# Patient Record
Sex: Female | Born: 1960 | Race: White | Hispanic: No | Marital: Married | Smoking: Former smoker
Health system: Southern US, Community
[De-identification: ages and names within clinical notes are randomized; demographics above are authoritative.]

## PROBLEM LIST (undated history)

## (undated) DIAGNOSIS — B029 Zoster without complications: Secondary | ICD-10-CM

## (undated) DIAGNOSIS — I341 Nonrheumatic mitral (valve) prolapse: Secondary | ICD-10-CM

## (undated) DIAGNOSIS — B019 Varicella without complication: Secondary | ICD-10-CM

## (undated) DIAGNOSIS — E7800 Pure hypercholesterolemia, unspecified: Secondary | ICD-10-CM

## (undated) DIAGNOSIS — I10 Essential (primary) hypertension: Secondary | ICD-10-CM

## (undated) HISTORY — DX: Essential (primary) hypertension: I10

## (undated) HISTORY — DX: Pure hypercholesterolemia, unspecified: E78.00

## (undated) HISTORY — DX: Zoster without complications: B02.9

## (undated) HISTORY — DX: Varicella without complication: B01.9

## (undated) HISTORY — DX: Nonrheumatic mitral (valve) prolapse: I34.1

---
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
## 1978-09-25 DIAGNOSIS — I10 Essential (primary) hypertension: Secondary | ICD-10-CM

## 1978-09-25 HISTORY — DX: Essential (primary) hypertension: I10

## 1979-09-26 HISTORY — PX: AMPUTATION FINGER: SHX6594

## 1998-04-30 ENCOUNTER — Encounter: Admission: RE | Admit: 1998-04-30 | Discharge: 1998-07-29 | Payer: Self-pay | Admitting: Internal Medicine

## 1999-08-30 ENCOUNTER — Other Ambulatory Visit: Admission: RE | Admit: 1999-08-30 | Discharge: 1999-08-30 | Payer: Self-pay | Admitting: Obstetrics and Gynecology

## 2000-05-25 ENCOUNTER — Encounter: Admission: RE | Admit: 2000-05-25 | Discharge: 2000-05-25 | Payer: Self-pay | Admitting: Internal Medicine

## 2000-05-25 ENCOUNTER — Encounter: Payer: Self-pay | Admitting: Internal Medicine

## 2000-10-23 ENCOUNTER — Other Ambulatory Visit: Admission: RE | Admit: 2000-10-23 | Discharge: 2000-10-23 | Payer: Self-pay | Admitting: Obstetrics and Gynecology

## 2000-11-29 ENCOUNTER — Encounter: Payer: Self-pay | Admitting: Surgery

## 2000-11-29 ENCOUNTER — Ambulatory Visit (HOSPITAL_COMMUNITY): Admission: RE | Admit: 2000-11-29 | Discharge: 2000-11-29 | Payer: Self-pay | Admitting: Surgery

## 2001-10-08 ENCOUNTER — Encounter: Payer: Self-pay | Admitting: Obstetrics and Gynecology

## 2001-10-08 ENCOUNTER — Encounter: Admission: RE | Admit: 2001-10-08 | Discharge: 2001-10-08 | Payer: Self-pay | Admitting: Obstetrics and Gynecology

## 2001-10-31 ENCOUNTER — Other Ambulatory Visit: Admission: RE | Admit: 2001-10-31 | Discharge: 2001-10-31 | Payer: Self-pay | Admitting: Obstetrics and Gynecology

## 2002-10-07 LAB — HM COLONOSCOPY

## 2003-05-20 ENCOUNTER — Other Ambulatory Visit: Admission: RE | Admit: 2003-05-20 | Discharge: 2003-05-20 | Payer: Self-pay | Admitting: Obstetrics and Gynecology

## 2003-05-26 ENCOUNTER — Encounter: Payer: Self-pay | Admitting: Obstetrics and Gynecology

## 2003-05-26 ENCOUNTER — Encounter: Admission: RE | Admit: 2003-05-26 | Discharge: 2003-05-26 | Payer: Self-pay | Admitting: Obstetrics and Gynecology

## 2003-06-20 ENCOUNTER — Emergency Department (HOSPITAL_COMMUNITY): Admission: EM | Admit: 2003-06-20 | Discharge: 2003-06-20 | Payer: Self-pay

## 2003-09-26 HISTORY — PX: BREAST EXCISIONAL BIOPSY: SUR124

## 2003-09-26 HISTORY — PX: BREAST LUMPECTOMY: SHX2

## 2005-12-13 ENCOUNTER — Other Ambulatory Visit: Admission: RE | Admit: 2005-12-13 | Discharge: 2005-12-13 | Payer: Self-pay | Admitting: Obstetrics and Gynecology

## 2005-12-13 ENCOUNTER — Encounter: Admission: RE | Admit: 2005-12-13 | Discharge: 2005-12-13 | Payer: Self-pay | Admitting: Obstetrics and Gynecology

## 2006-01-02 ENCOUNTER — Encounter: Admission: RE | Admit: 2006-01-02 | Discharge: 2006-01-02 | Payer: Self-pay | Admitting: Obstetrics and Gynecology

## 2007-01-14 ENCOUNTER — Encounter: Admission: RE | Admit: 2007-01-14 | Discharge: 2007-01-14 | Payer: Self-pay | Admitting: Obstetrics and Gynecology

## 2008-02-12 ENCOUNTER — Encounter: Admission: RE | Admit: 2008-02-12 | Discharge: 2008-02-12 | Payer: Self-pay | Admitting: Family Medicine

## 2008-02-21 ENCOUNTER — Encounter: Admission: RE | Admit: 2008-02-21 | Discharge: 2008-02-21 | Payer: Self-pay | Admitting: Family Medicine

## 2009-02-15 ENCOUNTER — Encounter: Admission: RE | Admit: 2009-02-15 | Discharge: 2009-02-15 | Payer: Self-pay | Admitting: Family Medicine

## 2010-04-04 ENCOUNTER — Encounter: Admission: RE | Admit: 2010-04-04 | Discharge: 2010-04-04 | Payer: Self-pay | Admitting: Obstetrics and Gynecology

## 2011-02-10 NOTE — Op Note (Signed)
[place]  Patient:    Danielle Humphrey, Danielle Humphrey                        MRN: [id number] Proc. Date: 11/29/00 Attending:  Sandria Bales. Ezzard Standing, M.D. CC:         Huey Bienenstock McDiarmid, M.D.             Modesta Messing, M.D.                           Operative Report  PREOPERATIVE DIAGNOSIS:  Bilateral breast masses.  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  PROCEDURE:  Bilateral excisional breast biopsies using ultrasound localization.  SURGEON:  Sandria Bales. Ezzard Standing, M.D.  ANESTHESIA:  General LMA, with 30 cc of 0.25% Marcaine.  INDICATION FOR PROCEDURE:  Ms. Maniscalco is a 50 year old white female who has bilateral palpable breast masses.  She now comes for excision of these masses.  DESCRIPTION OF PROCEDURE:  Patient placed in supine position.  Before she was given a general LMA anesthesia, I used a 7.5 megahertz ultrasound probe to localize a mass in the right breast at the 10 oclock position.  It was actually fairly easily palpable, it felt soft and rubbery, and then the mass in the left breast in the 3 oclock position, which was less palpable, both areas were marked.  The breast was then prepped with Betadine solution and sterilely draped.  I then made an incision directly over the mass on the left side.  I used approximately 15 cc of 0.25% Marcaine as a local anesthetic, cut down, excising a block of breast tissue about 3 x 4 cm, and this palpated like something like a fibroadenoma.  I sent it for permanent pathology.  The wound was then irrigated, hemostasis controlled with 3-0 Vicryl sutures and Bovie electrocautery.  Subcutaneous tissues were closed with 3-0 Vicryl suture and the skin closed with a 5-0 Vicryl suture, painted with tincture of benzoin and Steri-Strips and sterilely dressed.  The right side, I approached the same way, again identifying the  mass through the markings, infiltrating using about 15 cc of 0.25% Marcaine plain.  I excised down to the mass, and in this area I was able to excise an approximately a 3.5 x 2 cm lipoma.  It had a totally benign appearance, but I sent this off for pathology also.  I then irrigated the wound.  Hemostasis was controlled with Bovie electrocautery and 3-0 Vicryl sutures.  The subcutaneous tissues were closed with 3-0 Vicryl sutures and the skin closed with 5-0 Vicryl suture.  Both wounds were painted with tincture of benzoin and Steri-Strips, sterilely dressed with 4 x 4s and Hypafix.  The patient will be discharged home today, see me back in one week for follow-up.  I will review the pathology with her and check her incisions. DD:  11/29/00 TD:  11/29/00 Job: [id number]

## 2011-04-19 ENCOUNTER — Other Ambulatory Visit: Payer: Self-pay | Admitting: Obstetrics and Gynecology

## 2011-04-19 DIAGNOSIS — Z1231 Encounter for screening mammogram for malignant neoplasm of breast: Secondary | ICD-10-CM

## 2011-04-24 ENCOUNTER — Ambulatory Visit
Admission: RE | Admit: 2011-04-24 | Discharge: 2011-04-24 | Disposition: A | Discharge status: Home / Self Care | Payer: 59 | Source: Ambulatory Visit | Attending: Obstetrics and Gynecology | Admitting: Obstetrics and Gynecology

## 2011-04-24 DIAGNOSIS — Z1231 Encounter for screening mammogram for malignant neoplasm of breast: Secondary | ICD-10-CM

## 2011-04-26 ENCOUNTER — Other Ambulatory Visit: Payer: Self-pay | Admitting: Obstetrics and Gynecology

## 2011-04-26 DIAGNOSIS — R928 Other abnormal and inconclusive findings on diagnostic imaging of breast: Secondary | ICD-10-CM

## 2011-05-04 ENCOUNTER — Ambulatory Visit
Admission: RE | Admit: 2011-05-04 | Discharge: 2011-05-04 | Disposition: A | Discharge status: Home / Self Care | Payer: 59 | Source: Ambulatory Visit | Attending: Obstetrics and Gynecology | Admitting: Obstetrics and Gynecology

## 2011-05-04 DIAGNOSIS — R928 Other abnormal and inconclusive findings on diagnostic imaging of breast: Secondary | ICD-10-CM

## 2012-06-27 ENCOUNTER — Other Ambulatory Visit: Payer: Self-pay | Admitting: Obstetrics and Gynecology

## 2012-06-27 DIAGNOSIS — Z1231 Encounter for screening mammogram for malignant neoplasm of breast: Secondary | ICD-10-CM

## 2012-07-22 ENCOUNTER — Ambulatory Visit
Admission: RE | Admit: 2012-07-22 | Discharge: 2012-07-22 | Disposition: A | Discharge status: Home / Self Care | Payer: 59 | Source: Ambulatory Visit | Attending: Obstetrics and Gynecology | Admitting: Obstetrics and Gynecology

## 2012-07-22 DIAGNOSIS — Z1231 Encounter for screening mammogram for malignant neoplasm of breast: Secondary | ICD-10-CM

## 2012-10-07 ENCOUNTER — Other Ambulatory Visit: Payer: Self-pay | Admitting: Gastroenterology

## 2013-06-17 ENCOUNTER — Other Ambulatory Visit: Payer: Self-pay

## 2013-06-17 DIAGNOSIS — Z1231 Encounter for screening mammogram for malignant neoplasm of breast: Secondary | ICD-10-CM

## 2013-07-23 ENCOUNTER — Ambulatory Visit
Admission: RE | Admit: 2013-07-23 | Discharge: 2013-07-23 | Disposition: A | Discharge status: Home / Self Care | Payer: 59 | Source: Ambulatory Visit

## 2013-07-23 DIAGNOSIS — Z1231 Encounter for screening mammogram for malignant neoplasm of breast: Secondary | ICD-10-CM

## 2013-11-03 ENCOUNTER — Emergency Department: Payer: Self-pay | Admitting: Emergency Medicine

## 2013-11-03 LAB — CBC
HCT: 41 % (ref 35.0–47.0)
HGB: 13.5 g/dL (ref 12.0–16.0)
MCH: 29.8 pg (ref 26.0–34.0)
MCHC: 32.8 g/dL (ref 32.0–36.0)
MCV: 91 fL (ref 80–100)
Platelet: 259 10*3/uL (ref 150–440)
RBC: 4.52 10*6/uL (ref 3.80–5.20)
RDW: 12.3 % (ref 11.5–14.5)
WBC: 8.1 10*3/uL (ref 3.6–11.0)

## 2013-11-03 LAB — COMPREHENSIVE METABOLIC PANEL
ALBUMIN: 4.1 g/dL (ref 3.4–5.0)
Alkaline Phosphatase: 34 U/L — ABNORMAL LOW
Anion Gap: 9 (ref 7–16)
BUN: 15 mg/dL (ref 7–18)
Bilirubin,Total: 0.4 mg/dL (ref 0.2–1.0)
Calcium, Total: 9.7 mg/dL (ref 8.5–10.1)
Chloride: 105 mmol/L (ref 98–107)
Co2: 24 mmol/L (ref 21–32)
Creatinine: 0.84 mg/dL (ref 0.60–1.30)
EGFR (Non-African Amer.): >60
GLUCOSE: 95 mg/dL (ref 65–99)
Osmolality: 276 (ref 275–301)
Potassium: 3.3 mmol/L — ABNORMAL LOW (ref 3.5–5.1)
SGOT(AST): 27 U/L (ref 15–37)
SGPT (ALT): 21 U/L (ref 12–78)
SODIUM: 138 mmol/L (ref 136–145)
TOTAL PROTEIN: 7.7 g/dL (ref 6.4–8.2)

## 2013-11-03 LAB — TROPONIN I: Troponin-I: <0.02 ng/mL

## 2013-11-04 ENCOUNTER — Other Ambulatory Visit (HOSPITAL_COMMUNITY): Payer: Self-pay | Admitting: Internal Medicine

## 2013-11-04 DIAGNOSIS — I341 Nonrheumatic mitral (valve) prolapse: Secondary | ICD-10-CM

## 2013-11-06 ENCOUNTER — Ambulatory Visit (INDEPENDENT_AMBULATORY_CARE_PROVIDER_SITE_OTHER): Payer: 59 | Admitting: Cardiology

## 2013-11-06 VITALS — BP 110/70 | HR 77 | Ht 70.0 in | Wt 175.0 lb

## 2013-11-06 DIAGNOSIS — R0989 Other specified symptoms and signs involving the circulatory and respiratory systems: Secondary | ICD-10-CM

## 2013-11-06 DIAGNOSIS — R0609 Other forms of dyspnea: Secondary | ICD-10-CM

## 2013-11-06 DIAGNOSIS — R079 Chest pain, unspecified: Secondary | ICD-10-CM

## 2013-11-06 DIAGNOSIS — R0789 Other chest pain: Secondary | ICD-10-CM

## 2013-11-06 DIAGNOSIS — R42 Dizziness and giddiness: Secondary | ICD-10-CM

## 2013-11-06 DIAGNOSIS — R0602 Shortness of breath: Secondary | ICD-10-CM

## 2013-11-06 NOTE — Patient Instructions (Signed)
Your physician recommends that you schedule a follow-up appointment in: 2 weeks to see Lyda Jester PA

## 2013-11-09 ENCOUNTER — Encounter: Payer: Self-pay | Admitting: Cardiology

## 2013-11-09 DIAGNOSIS — R42 Dizziness and giddiness: Secondary | ICD-10-CM | POA: Insufficient documentation

## 2013-11-09 DIAGNOSIS — R0609 Other forms of dyspnea: Secondary | ICD-10-CM

## 2013-11-09 DIAGNOSIS — R0789 Other chest pain: Secondary | ICD-10-CM | POA: Insufficient documentation

## 2013-11-09 NOTE — Assessment & Plan Note (Addendum)
With associated fatigue. Her PCP has already ordered a 2D echo. I agree to this. This will be done in our office.

## 2013-11-09 NOTE — Assessment & Plan Note (Signed)
Atypical. Mostly mid scapular and sharp, but occurs and is exacerbated by exertion with associated dyspnea, fatigue and dizziness. No symptoms at rest. EKG is w/o ischemic changes. Will evalute with a nuclear stress test.

## 2013-11-09 NOTE — Progress Notes (Signed)
Patient ID: DAJHA URQUILLA, female   DOB: 04-04-1961, 53 y.o.   MRN: [id number]  11/09/2013 Guneet Delpino [facility]   01/22/1961  [id number]  Primary Physicia Thressa Sheller, MD Primary Cardiologist: Debara Pickett  HPI:  The patient is a 53 y/o female who presents for evaluation of intermittent mid scapular pain, dizziness, SOB and fatigue. She denies any past cardiac history, but states that she has seen Dr. Debara Pickett in the past for an echocardiogram several years ago.   She states that she has recently noticed mid scapular pain on exertion. It is atypical and described as a sharp pain. She notes mild radiation to the chest, but no radiation to the jaw or upper extremity. It is worse with exertion and relieved with rest. She first noticed this several weeks ago while walking in her neighborhood. She has had associated SOB and dizziness. No n/v, syncope or near syncope. No palpitations. This has happened 1-2 times since then, but symptoms have resolved spontaneously. She denies any symptoms at rest. In clinic she is asymptomatic.   She is followed medically by Dr. Alyson Ingles. She has HTN, but denies DM and HLD.    Current Outpatient Prescriptions  Medication Sig Dispense Refill  . lisinopril-hydrochlorothiazide (PRINZIDE,ZESTORETIC) 20-25 MG per tablet Take 1 tablet by mouth daily.      Marland Kitchen ALPRAZolam (XANAX) 0.5 MG tablet       . felodipine (PLENDIL) 5 MG 24 hr tablet        No current facility-administered medications for this visit.    Allergies not on file  History   Social History  . Marital Status: Married    Spouse Name: N/A    Number of Children: N/A  . Years of Education: N/A   Occupational History  . Not on file.   Social History Main Topics  . Smoking status: Not on file  . Smokeless tobacco: Not on file  . Alcohol Use: Not on file  . Drug Use: Not on file  . Sexual Activity: Not on file   Other Topics Concern  . Not on file   Social History Narrative  . No narrative on file     Review  of Systems: General: negative for chills, fever, night sweats or weight changes.  Cardiovascular: negative for chest pain, dyspnea on exertion, edema, orthopnea, palpitations, paroxysmal nocturnal dyspnea or shortness of breath Dermatological: negative for rash Respiratory: negative for cough or wheezing Urologic: negative for hematuria Abdominal: negative for nausea, vomiting, diarrhea, bright red blood per rectum, melena, or hematemesis Neurologic: negative for visual changes, syncope, or dizziness All other systems reviewed and are otherwise negative except as noted above.    Blood pressure 110/70, pulse 77, height 5\' 10"  (1.778 m), weight 175 lb (79.379 kg).  General appearance: alert, cooperative and no distress Neck: no carotid bruit and no JVD Lungs: clear to auscultation bilaterally Heart: regular rate and rhythm Extremities: no LEE Pulses: 2+ and symmetric Skin: warm and dry Neurologic: Grossly normal  EKG NSR. No ischemic changes  ASSESSMENT AND PLAN:   Chest pain, atypical Atypical. Mostly mid scapular and sharp, but occurs and is exacerbated by exertion with associated dyspnea, fatigue and dizziness. No symptoms at rest. EKG is w/o ischemic changes. Will evalute with a nuclear stress test.   Dyspnea on exertion With associated fatigue. Her PCP has already ordered a 2D echo. I agree to this. This will be done in our office.   Dizziness This has been intermittent. Pt denies palpitations. EKG is w/o arrthymias.  HR and BP stable. Orthostatics were checked and she is not orthostatic. Physical exam was negative for murmurs and carotid bruits. If above w/u is normal and problem persist, will further evaluate with event monitor.     PLAN  Will work up with NST and 2D echo. Will have patient f/u in 1-2 weeks to review the results. Pt was instructed to go to ER if her symptoms return and do not improve with rest. She verbalized understanding.    SIMMONS,  BRITTAINYPA-C 11/09/2013 7:17 PM

## 2013-11-09 NOTE — Assessment & Plan Note (Signed)
This has been intermittent. Pt denies palpitations. EKG is w/o arrthymias. HR and BP stable. Orthostatics were checked and she is not orthostatic. Physical exam was negative for murmurs and carotid bruits. If above w/u is normal and problem persist, will further evaluate with event monitor.

## 2013-11-12 ENCOUNTER — Ambulatory Visit (HOSPITAL_COMMUNITY): Payer: 59

## 2013-11-14 ENCOUNTER — Ambulatory Visit (HOSPITAL_COMMUNITY)
Admission: RE | Admit: 2013-11-14 | Discharge: 2013-11-14 | Disposition: A | Discharge status: Home / Self Care | Payer: 59 | Source: Ambulatory Visit | Attending: Internal Medicine | Admitting: Internal Medicine

## 2013-11-14 DIAGNOSIS — R0609 Other forms of dyspnea: Secondary | ICD-10-CM | POA: Insufficient documentation

## 2013-11-14 DIAGNOSIS — I10 Essential (primary) hypertension: Secondary | ICD-10-CM | POA: Insufficient documentation

## 2013-11-14 DIAGNOSIS — R0602 Shortness of breath: Secondary | ICD-10-CM

## 2013-11-14 DIAGNOSIS — R5383 Other fatigue: Secondary | ICD-10-CM

## 2013-11-14 DIAGNOSIS — Z87891 Personal history of nicotine dependence: Secondary | ICD-10-CM | POA: Insufficient documentation

## 2013-11-14 DIAGNOSIS — I059 Rheumatic mitral valve disease, unspecified: Secondary | ICD-10-CM | POA: Insufficient documentation

## 2013-11-14 DIAGNOSIS — R002 Palpitations: Secondary | ICD-10-CM | POA: Insufficient documentation

## 2013-11-14 DIAGNOSIS — R079 Chest pain, unspecified: Secondary | ICD-10-CM | POA: Insufficient documentation

## 2013-11-14 DIAGNOSIS — R0989 Other specified symptoms and signs involving the circulatory and respiratory systems: Secondary | ICD-10-CM | POA: Insufficient documentation

## 2013-11-14 DIAGNOSIS — Z8249 Family history of ischemic heart disease and other diseases of the circulatory system: Secondary | ICD-10-CM | POA: Insufficient documentation

## 2013-11-14 DIAGNOSIS — R42 Dizziness and giddiness: Secondary | ICD-10-CM | POA: Insufficient documentation

## 2013-11-14 DIAGNOSIS — R5381 Other malaise: Secondary | ICD-10-CM | POA: Insufficient documentation

## 2013-11-14 MED ORDER — TECHNETIUM TC 99M SESTAMIBI GENERIC - CARDIOLITE
10.5000 | Freq: Once | INTRAVENOUS | Status: AC | PRN
  Administered 2013-11-14: 11 via INTRAVENOUS

## 2013-11-14 MED ORDER — REGADENOSON 0.4 MG/5ML IV SOLN
0.4000 mg | Freq: Once | INTRAVENOUS | Status: AC
  Administered 2013-11-14: 0.4 mg via INTRAVENOUS

## 2013-11-14 MED ORDER — TECHNETIUM TC 99M SESTAMIBI GENERIC - CARDIOLITE
30.2000 | Freq: Once | INTRAVENOUS | Status: AC | PRN
  Administered 2013-11-14: 30.2 via INTRAVENOUS

## 2013-11-14 MED ORDER — AMINOPHYLLINE 25 MG/ML IV SOLN
75.0000 mg | Freq: Once | INTRAVENOUS | Status: AC
  Administered 2013-11-14: 75 mg via INTRAVENOUS

## 2013-11-14 NOTE — Procedures (Addendum)
[place] [address] [place] [address] [phone]  Cardiology Nuclear Med Study  Danielle Humphrey is a 53 y.o. female     MRN : [id number]     DOB: 08-Aug-1961  Procedure Date: 11/14/2013  Nuclear Med Background Indication for Stress Test:  Evaluation for Ischemia and [facility] History:  MVP Cardiac Risk Factors: Family History - CAD, History of Smoking, Hypertension, Lipids and Overweight  Symptoms:  Chest Pain, Dizziness, DOE, Fatigue, Palpitations and SOB   Nuclear Pre-Procedure Caffeine/Decaff Intake:  1:00am NPO After: 11am   IV Site: R Hand  IV 0.9% NS with Angio Cath:  22g  Chest Size (in):  n/a IV Started by: Azucena Cecil, RN  Height: 5\' 10"  (1.778 m)  Cup Size: D  BMI:  Body mass index is 25.11 kg/(m^2). Weight:  175 lb (79.379 kg)   Tech Comments:  n/a    Nuclear Med Study 1 or 2 day study: 1 day  Stress Test Type:  [place] Provider:  Lyman Bishop, MD   Resting Radionuclide: Technetium 47m Sestamibi  Resting Radionuclide Dose: 10.5 mCi   Stress Radionuclide:  Technetium 52m Sestamibi  Stress Radionuclide Dose: 30.2 mCi           Stress Protocol Rest HR:61 Stress HR: 96  Rest BP: 139/101 Stress BP:147/91  Exercise Time (min): n/a METS: n/a          Dose of Adenosine (mg):  n/a Dose of Lexiscan: 0.4 mg  Dose of Atropine (mg): n/a Dose of Dobutamine: n/a mcg/kg/min (at max HR)  Stress Test Technologist: Mellody Memos, CCT Nuclear Technologist: Imagene Riches, CNMT   Rest Procedure:  Myocardial perfusion imaging was performed at rest 45 minutes following the intravenous administration of Technetium 60m Sestamibi. Stress Procedure:  The patient received IV Lexiscan 0.4 mg over 15-seconds.  Technetium 64m Sestamibi injected at 30-seconds. Due to patient's shortness of breath, dizziness and light-headedness, she was given IV Aminophylline 75 mg. Symptoms were resolved during  recovery. There were no significant changes with Lexiscan.  Quantitative spect images were obtained after a 45 minute delay.  Transient Ischemic Dilatation (Normal <1.22):  0.94 Lung/Heart Ratio (Normal <0.45):  0.28 QGS EDV:  78 ml QGS ESV:  20 ml LV Ejection Fraction: 74%  Signed by Joellyn Quails, Rad Tech on 11/14/2013 at 2:55 PM.      Rest ECG: NSR with non-specific ST-T wave changes  Stress ECG: No significant change from baseline ECG  QPS Raw Data Images:  Normal; no motion artifact; normal heart/lung ratio. Stress Images:  Normal homogeneous uptake in all areas of the myocardium. Rest Images:  Normal homogeneous uptake in all areas of the myocardium. Subtraction (SDS):  Normal LV Wall Motion:  NL LV Function; NL Wall Motion  Impression Exercise Capacity:  Lexiscan with no exercise. BP Response:  Hypertensive blood pressure response. Clinical Symptoms:  No significant symptoms noted. ECG Impression:  No significant ECG changes with Lexiscan. Comparison with Prior Nuclear Study: No previous nuclear study performed   Overall Impression:  Normal stress nuclear study.   Sanda Klein, MD  11/14/2013 5:24 PM

## 2013-11-18 ENCOUNTER — Ambulatory Visit: Payer: 59 | Admitting: Cardiology

## 2013-11-20 ENCOUNTER — Ambulatory Visit (HOSPITAL_COMMUNITY): Payer: 59

## 2013-11-26 ENCOUNTER — Ambulatory Visit (HOSPITAL_COMMUNITY)
Admission: RE | Admit: 2013-11-26 | Discharge: 2013-11-26 | Disposition: A | Discharge status: Home / Self Care | Payer: 59 | Source: Ambulatory Visit | Attending: Cardiovascular Disease | Admitting: Cardiovascular Disease

## 2013-11-26 DIAGNOSIS — I341 Nonrheumatic mitral (valve) prolapse: Secondary | ICD-10-CM

## 2013-11-26 DIAGNOSIS — I059 Rheumatic mitral valve disease, unspecified: Secondary | ICD-10-CM | POA: Insufficient documentation

## 2013-11-26 NOTE — Progress Notes (Signed)
2D Echo Performed 11/26/2013    Laurice Kimmons, RCS  

## 2013-11-27 ENCOUNTER — Ambulatory Visit (INDEPENDENT_AMBULATORY_CARE_PROVIDER_SITE_OTHER): Payer: 59 | Admitting: Cardiology

## 2013-11-27 VITALS — BP 110/80 | HR 72 | Ht 70.0 in | Wt 175.0 lb

## 2013-11-27 DIAGNOSIS — R0989 Other specified symptoms and signs involving the circulatory and respiratory systems: Secondary | ICD-10-CM

## 2013-11-27 DIAGNOSIS — R7989 Other specified abnormal findings of blood chemistry: Secondary | ICD-10-CM

## 2013-11-27 DIAGNOSIS — R0609 Other forms of dyspnea: Secondary | ICD-10-CM

## 2013-11-27 DIAGNOSIS — R0789 Other chest pain: Secondary | ICD-10-CM

## 2013-11-27 DIAGNOSIS — R42 Dizziness and giddiness: Secondary | ICD-10-CM

## 2013-11-27 DIAGNOSIS — R791 Abnormal coagulation profile: Secondary | ICD-10-CM

## 2013-11-27 LAB — CBC
HEMATOCRIT: 39.9 % (ref 36.0–46.0)
HEMOGLOBIN: 13.4 g/dL (ref 12.0–15.0)
MCH: 29.6 pg (ref 26.0–34.0)
MCHC: 33.6 g/dL (ref 30.0–36.0)
MCV: 88.1 fL (ref 78.0–100.0)
Platelets: 294 10*3/uL (ref 150–400)
RBC: 4.53 MIL/uL (ref 3.87–5.11)
RDW: 13.2 % (ref 11.5–15.5)
WBC: 7.6 10*3/uL (ref 4.0–10.5)

## 2013-11-27 LAB — BASIC METABOLIC PANEL
BUN: 18 mg/dL (ref 6–23)
CALCIUM: 10.6 mg/dL — AB (ref 8.4–10.5)
CHLORIDE: 103 meq/L (ref 96–112)
CO2: 26 meq/L (ref 19–32)
CREATININE: 0.88 mg/dL (ref 0.50–1.10)
Glucose, Bld: 130 mg/dL — ABNORMAL HIGH (ref 70–99)
POTASSIUM: 3.6 meq/L (ref 3.5–5.3)
SODIUM: 139 meq/L (ref 135–145)

## 2013-11-27 NOTE — Assessment & Plan Note (Addendum)
Normal nuclear stress test on 11/14/13. EF estimated at 74%

## 2013-11-27 NOTE — Patient Instructions (Signed)
Get Lab work done today (D-dimer, CBC and BMP) Wear heart monitor for 2 weeks. Activate heart monitor when you experience symptoms Return in 2-3 weeks with Lyda Jester, PA-C for follow-up

## 2013-11-27 NOTE — Assessment & Plan Note (Addendum)
2D echo on 11/14/13 demonstrated normal systolic function with an EF of 38-18%, grade I diastolic dysfunction, mild MR w/o prolapse. With continued DOE and atypical chest pain will check a D-dimer to r/o potential PE. Will also check CBC to r/o anemia.

## 2013-11-27 NOTE — Assessment & Plan Note (Addendum)
She continues to have intermittent dizziness with associated SOB and chest pain. She denies palpitations.  However, will prescribe a 2 week event monitor to assess for potential tachy/ brady arrhthymias.

## 2013-11-28 ENCOUNTER — Ambulatory Visit (HOSPITAL_COMMUNITY)
Admission: RE | Admit: 2013-11-28 | Discharge: 2013-11-28 | Disposition: A | Discharge status: Home / Self Care | Payer: 59 | Source: Ambulatory Visit | Attending: Cardiology | Admitting: Cardiology

## 2013-11-28 ENCOUNTER — Encounter: Payer: Self-pay | Admitting: Cardiology

## 2013-11-28 ENCOUNTER — Telehealth: Payer: Self-pay | Admitting: Cardiology

## 2013-11-28 DIAGNOSIS — R0602 Shortness of breath: Secondary | ICD-10-CM | POA: Insufficient documentation

## 2013-11-28 DIAGNOSIS — R0609 Other forms of dyspnea: Secondary | ICD-10-CM

## 2013-11-28 DIAGNOSIS — I714 Abdominal aortic aneurysm, without rupture, unspecified: Secondary | ICD-10-CM | POA: Insufficient documentation

## 2013-11-28 DIAGNOSIS — R0789 Other chest pain: Secondary | ICD-10-CM

## 2013-11-28 DIAGNOSIS — R7989 Other specified abnormal findings of blood chemistry: Secondary | ICD-10-CM

## 2013-11-28 DIAGNOSIS — R079 Chest pain, unspecified: Secondary | ICD-10-CM | POA: Insufficient documentation

## 2013-11-28 LAB — D-DIMER, QUANTITATIVE: D-Dimer, Quant: 1.33 ug/mL-FEU — ABNORMAL HIGH (ref 0.00–0.48)

## 2013-11-28 MED ORDER — IOHEXOL 350 MG/ML SOLN
100.0000 mL | Freq: Once | INTRAVENOUS | Status: AC | PRN
  Administered 2013-11-28: 100 mL via INTRAVENOUS

## 2013-11-28 NOTE — Telephone Encounter (Signed)
I ordered a D-dimer yesterday at office visit, as the patient has had continued DOE and chest pain over the last several weeks. Cardiac w/u including a NST and 2D echo was normal.The D-dimer was elevated at 1.33. She was notified via phone by office RN of the abnormal value. She has been instructed to report to [facility] for a CT Angio to r/o potential PE. She stated that she would report there promptly.  Lyda Jester, PA-C [facility]

## 2013-11-28 NOTE — Progress Notes (Signed)
Patient ID: Danielle Humphrey, female   DOB: January 16, 1961, 53 y.o.   MRN: [id number]    11/28/2013 Tashara Suder [facility]   09-02-1961  [id number]  Primary Physicia Thressa Sheller, MD Primary Cardiologist: Debara Pickett  HPI:  Danielle Humphrey returns to clinic for follow up for ongoing DOE, intermittent left sided chest pain and dizziness. Her only PMH is significant for treated HTN. She is followed medically by Dr. Noah Delaine.  She was last seen by myself on 11/06/13. At that visit, I ordered both a NST and 2D echocardiogram. She had a normal nuclear stress study. The echocardiogram demonstrated normal systolic function with an EF of 33-00%, grade I diastolic dysfunction and mild MR w/o prolapse.   Today, she states that she continues to have symptoms with light exertion. No symptoms at rest. Although she has experienced SOB, CP and dizziness, she denies palpitations and syncope/ near syncope.    Current Outpatient Prescriptions  Medication Sig Dispense Refill  . ALPRAZolam (XANAX) 0.5 MG tablet       . felodipine (PLENDIL) 5 MG 24 hr tablet       . lisinopril-hydrochlorothiazide (PRINZIDE,ZESTORETIC) 20-25 MG per tablet Take 1 tablet by mouth daily.       No current facility-administered medications for this visit.    No Known Allergies  History   Social History  . Marital Status: Married    Spouse Name: N/A    Number of Children: N/A  . Years of Education: N/A   Occupational History  . Not on file.   Social History Main Topics  . Smoking status: Never Smoker   . Smokeless tobacco: Not on file  . Alcohol Use: Not on file  . Drug Use: Not on file  . Sexual Activity: Not on file   Other Topics Concern  . Not on file   Social History Narrative  . No narrative on file     Review of Systems: General: negative for chills, fever, night sweats or weight changes.  Cardiovascular: negative for chest pain, dyspnea on exertion, edema, orthopnea, palpitations, paroxysmal nocturnal dyspnea or shortness of  breath Dermatological: negative for rash Respiratory: negative for cough or wheezing Urologic: negative for hematuria Abdominal: negative for nausea, vomiting, diarrhea, bright red blood per rectum, melena, or hematemesis Neurologic: negative for visual changes, syncope, or dizziness All other systems reviewed and are otherwise negative except as noted above.    Blood pressure 110/80, pulse 72, height 5\' 10"  (1.778 m), weight 175 lb (79.379 kg).  General appearance: alert, cooperative and no distress Neck: no carotid bruit and no JVD Lungs: clear to auscultation bilaterally Heart: regular rate and rhythm, S1, S2 normal, no murmur, click, rub or gallop Extremities: no LEE Pulses: 2+ and symmetric Skin: warm and dry Neurologic: Grossly normal  EKG NSR with nonspecific Twave abnormalities.   ASSESSMENT AND PLAN:   Chest pain, atypical Normal nuclear stress test on 11/14/13. EF estimated at 74%   Dyspnea on exertion 2D echo on 11/14/13 demonstrated normal systolic function with an EF of 76-22%, grade I diastolic dysfunction, mild MR w/o prolapse. With continued DOE and atypical chest pain will check a D-dimer to r/o potential PE. Will also check CBC to r/o anemia.    Dizziness She continues to have intermittent dizziness with associated SOB and chest pain. She denies palpitations.  However, will prescribe a 2 week event monitor to assess for potential tachy/ brady arrhthymias.    PLAN  Pt continues to have DOE with associated chest pain and dizziness.  F/U thus far, including a NST and 2 D echo, has been normal. In the setting of continued symptoms, I will order a D-dimer to help r/o potential PE. Will also check a CBC to rule out anemia. Will also arrange a 2 week event monitor to assess for any potential tachy/brady arrhthymias. I have also ordered a BMP to assess electrolytes and renal function. If D-dimer returns elevated, will further assess with a CT Angio. If normal, will have  patient return in 2-3 weeks to review telemetry results.   SIMMONS, BRITTAINYPA-C 11/28/2013 3:48 PM

## 2013-12-02 ENCOUNTER — Telehealth: Payer: Self-pay | Admitting: Nurse Practitioner

## 2013-12-02 NOTE — Telephone Encounter (Signed)
Thanks Museum/gallery conservator. I just called the patient and notified her that everything was ok.

## 2013-12-02 NOTE — Telephone Encounter (Signed)
Returned call and pt verified x 2.  Pt informed message received and results not reviewed.  Informed she will be notified once reviewed and urgent results are usually notified immediately.  Pt also informed she is supposed to wear the heart monitor for 2 weeks and return for f/u appt.  Pt verbalized understanding and agreed w/ plan.  Pt stated she is just concerned b/c she was told to report to have the test done in 45 mins on Friday and hasn't heard anything back.  Pt informed Lyda Jester, PA-C will be notified so that her results can be reviewed and she will be notified.  Pt verbalized understanding and agreed w/ plan.  Message forwarded to [employer], PA-C.

## 2013-12-02 NOTE — Telephone Encounter (Signed)
I called patient and notified her that her CT was negative for PE. I apologized for any confusion.   Lyda Jester, PA-C

## 2013-12-02 NOTE — Telephone Encounter (Signed)
Pt had a test on Friday at [facility] still waiting for results. She also wants to know how long is she suppose to wear her monitor?

## 2013-12-17 ENCOUNTER — Ambulatory Visit (INDEPENDENT_AMBULATORY_CARE_PROVIDER_SITE_OTHER): Payer: 59 | Admitting: Cardiology

## 2013-12-17 ENCOUNTER — Encounter: Payer: Self-pay | Admitting: Cardiology

## 2013-12-17 VITALS — BP 139/89 | HR 60 | Ht 70.0 in | Wt 171.5 lb

## 2013-12-17 DIAGNOSIS — R42 Dizziness and giddiness: Secondary | ICD-10-CM

## 2013-12-17 DIAGNOSIS — R0609 Other forms of dyspnea: Secondary | ICD-10-CM

## 2013-12-17 DIAGNOSIS — R0789 Other chest pain: Secondary | ICD-10-CM

## 2013-12-17 DIAGNOSIS — R0600 Dyspnea, unspecified: Secondary | ICD-10-CM

## 2013-12-17 DIAGNOSIS — R0989 Other specified symptoms and signs involving the circulatory and respiratory systems: Secondary | ICD-10-CM

## 2013-12-17 NOTE — Patient Instructions (Signed)
Continue current medications as prescribed Follow-up with Dr. Debara Pickett in 6 months.

## 2013-12-19 NOTE — Progress Notes (Signed)
Patient ID: Danielle Humphrey, female   DOB: 04/26/1961, 53 y.o.   MRN: [id number]     12/16/2013 Danielle Humphrey [facility]   28-Jul-1961  [id number]  Primary Physicia Thressa Sheller, MD Primary Cardiologist: Debara Pickett  HPI:  Mrs. Danielle Humphrey returns to clinic for follow up for ongoing DOE, intermittent left sided chest pain and dizziness. Her only PMH is significant for treated HTN. She is followed medically by Dr. Noah Delaine.  She was initially seen by myself on 11/06/13. At that visit, I ordered both a NST and 2D echocardiogram. She had a normal nuclear stress study. The echocardiogram demonstrated normal systolic function with an EF of 01-60%, grade I diastolic dysfunction and mild MR w/o prolapse.   She presented back on 11/27/2013 and continued to have symptoms with light exertion, particularly SOB, slight chest discomfort and dizziness. I ordered a D-dimer, to rule out PE, given her continued dyspnea. It returned positive at 1.33. Subsequently, she underwent a chest CT, however this was negative for PE. CBC was also ordered but did not show anemia.  I also ordered a 2 week event monitor to assess for potential arrhthymias.  This showed a combination of sinus tachycardia and sinus bradycardia. She has had heart rates as high as the low 100s-110s, which correlates with the timing of her symptoms. Her bradycardia has occurred primarily at night and she has had rates in the 40s and 30s. No captured bradycardia during the day.   She returns to clinic for f/u. She continues to have mild symptoms but not as severe, now that she has a better piece of mind after her negative studies.     Current Outpatient Prescriptions  Medication Sig Dispense Refill  . ALPRAZolam (XANAX) 0.5 MG tablet       . felodipine (PLENDIL) 5 MG 24 hr tablet       . lisinopril-hydrochlorothiazide (PRINZIDE,ZESTORETIC) 20-25 MG per tablet Take 1 tablet by mouth daily.       No current facility-administered medications for this visit.    No Known  Allergies  History   Social History  . Marital Status: Married    Spouse Name: N/A    Number of Children: N/A  . Years of Education: N/A   Occupational History  . Not on file.   Social History Main Topics  . Smoking status: Never Smoker   . Smokeless tobacco: Not on file  . Alcohol Use: Not on file  . Drug Use: Not on file  . Sexual Activity: Not on file   Other Topics Concern  . Not on file   Social History Narrative  . No narrative on file     Review of Systems: General: negative for chills, fever, night sweats or weight changes.  Cardiovascular: negative for chest pain, dyspnea on exertion, edema, orthopnea, palpitations, paroxysmal nocturnal dyspnea or shortness of breath Dermatological: negative for rash Respiratory: negative for cough or wheezing Urologic: negative for hematuria Abdominal: negative for nausea, vomiting, diarrhea, bright red blood per rectum, melena, or hematemesis Neurologic: negative for visual changes, syncope, or dizziness All other systems reviewed and are otherwise negative except as noted above.    Blood pressure 139/89, pulse 60, height 5\' 10"  (1.778 m), weight 171 lb 8 oz (77.792 kg).  General appearance: alert, cooperative and no distress Neck: no carotid bruit and no JVD Lungs: clear to auscultation bilaterally Heart: regular rate and rhythm, S1, S2 normal, no murmur, click, rub or gallop Extremities: no LEE Pulses: 2+ and symmetric Skin: warm and dry  Neurologic: Grossly normal     ASSESSMENT AND PLAN:   Chest Pain/ Dyspnea: extensive w/u has been negative including NST, Chest CT, 2D echo and labs.   Dizziness: Pt noted to have sinus tach w/ rates in the low 100s-110s during these episodes, as captured on cardiac event monitor. There were no arrhthymias. 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 I've discussed case with Dr. Gwenlyn Found and he also reviewed her  Cardionet strips. He also recommends against use of BB/ CCB to control her symptoms and tachycardia.    PLAN  Follow up as needed.   Aiyonna Lucado, BRITTAINYPA-C 12/19/2013 3:21 PM

## 2014-03-25 ENCOUNTER — Other Ambulatory Visit: Payer: Self-pay | Admitting: *Deleted

## 2014-03-25 DIAGNOSIS — R0789 Other chest pain: Secondary | ICD-10-CM

## 2014-03-25 DIAGNOSIS — R0609 Other forms of dyspnea: Secondary | ICD-10-CM

## 2014-03-25 DIAGNOSIS — R42 Dizziness and giddiness: Secondary | ICD-10-CM

## 2014-09-25 DIAGNOSIS — I341 Nonrheumatic mitral (valve) prolapse: Secondary | ICD-10-CM

## 2014-09-25 DIAGNOSIS — B029 Zoster without complications: Secondary | ICD-10-CM

## 2014-09-25 HISTORY — DX: Zoster without complications: B02.9

## 2014-09-25 HISTORY — DX: Nonrheumatic mitral (valve) prolapse: I34.1

## 2015-04-23 ENCOUNTER — Other Ambulatory Visit: Payer: Self-pay

## 2015-04-23 DIAGNOSIS — Z1231 Encounter for screening mammogram for malignant neoplasm of breast: Secondary | ICD-10-CM

## 2015-05-17 ENCOUNTER — Ambulatory Visit
Admission: RE | Admit: 2015-05-17 | Discharge: 2015-05-17 | Disposition: A | Discharge status: Home / Self Care | Payer: 59 | Source: Ambulatory Visit

## 2015-05-17 DIAGNOSIS — Z1231 Encounter for screening mammogram for malignant neoplasm of breast: Secondary | ICD-10-CM

## 2015-09-26 DIAGNOSIS — E7800 Pure hypercholesterolemia, unspecified: Secondary | ICD-10-CM

## 2015-09-26 HISTORY — DX: Pure hypercholesterolemia, unspecified: E78.00

## 2016-07-04 ENCOUNTER — Other Ambulatory Visit: Payer: Self-pay | Admitting: Internal Medicine

## 2016-07-04 DIAGNOSIS — Z1231 Encounter for screening mammogram for malignant neoplasm of breast: Secondary | ICD-10-CM

## 2016-07-13 ENCOUNTER — Ambulatory Visit: Payer: 59

## 2016-07-27 ENCOUNTER — Ambulatory Visit
Admission: RE | Admit: 2016-07-27 | Discharge: 2016-07-27 | Disposition: A | Discharge status: Home / Self Care | Payer: 59 | Source: Ambulatory Visit | Attending: Internal Medicine | Admitting: Internal Medicine

## 2016-07-27 ENCOUNTER — Encounter: Payer: Self-pay | Admitting: Radiology

## 2016-07-27 DIAGNOSIS — Z1231 Encounter for screening mammogram for malignant neoplasm of breast: Secondary | ICD-10-CM

## 2016-10-19 ENCOUNTER — Encounter: Payer: Self-pay | Admitting: Family

## 2016-10-19 ENCOUNTER — Ambulatory Visit (INDEPENDENT_AMBULATORY_CARE_PROVIDER_SITE_OTHER): Payer: 59 | Admitting: Family

## 2016-10-19 VITALS — BP 120/78 | HR 67 | Temp 97.2°F | Resp 16 | Ht 69.25 in | Wt 176.0 lb

## 2016-10-19 DIAGNOSIS — E785 Hyperlipidemia, unspecified: Secondary | ICD-10-CM

## 2016-10-19 DIAGNOSIS — I10 Essential (primary) hypertension: Secondary | ICD-10-CM | POA: Diagnosis not present

## 2016-10-19 DIAGNOSIS — F411 Generalized anxiety disorder: Secondary | ICD-10-CM | POA: Diagnosis not present

## 2016-10-19 MED ORDER — FELODIPINE ER 5 MG PO TB24: 5.0000 mg | ORAL_TABLET | Freq: Every day | ORAL | 3 refills | Status: DC

## 2016-10-19 NOTE — Assessment & Plan Note (Signed)
At goal. Continue current regimen. 

## 2016-10-19 NOTE — Patient Instructions (Signed)
Pleasure meeting you.  Please make physical at your convienence

## 2016-10-19 NOTE — Progress Notes (Signed)
Pre-visit discussion using our clinic review tool. No additional management support is needed unless otherwise documented below in the visit note.  

## 2016-10-19 NOTE — Assessment & Plan Note (Signed)
Continue current regimen. Will check lipid panel at CPE.

## 2016-10-19 NOTE — Assessment & Plan Note (Signed)
Uncontrolled. Using xanax daily. Discussed risks of BZDs and patient agreed with long term plan of decreasing/discontinuing. Discussed zoloft and will likely try at next visit.

## 2016-10-19 NOTE — Progress Notes (Signed)
Subjective:    Patient ID: Danielle Humphrey, female    DOB: Sep 17, 1961, 56 y.o.   MRN: [id number]  CC: Danielle Humphrey is a 56 y.o. female who presents today to establish care.    HPI: Prior care had been with [facility], [facility].   HTN- Compliant with medication. Denies exertional chest pain or pressure, numbness or tingling radiating to left arm or jaw, palpitations, dizziness, frequent headaches, changes in vision, or shortness of breath.   HLD- compliant with medication.   Anxiety-Has been on years. xanax every day. Has panic attacks. Notes stress from work. Using for sleep. Tried paxil many years ago however didn't work. No depression.           HISTORY:  Past Medical History:  Diagnosis Date  . Chickenpox 1960  . Hypercholesteremia 2017  . Hypertension 1980  . Mitral valve prolapse 2016  . Shingles 2016   Past Surgical History:  Procedure Laterality Date  . 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 APPENDECTOMY Bilateral 1968  . BREAST LUMPECTOMY Bilateral 2005   Family History  Problem Relation Age of Onset  . Ovarian cancer Mother     24  . Breast cancer Mother     2005  . Dementia Mother   . Stroke Mother   . Lupus Mother   . Hypercholesterolemia Father   . Heart attack Father     12  . Hypertension Father   . Diabetes type II Sister   . Heart disease Brother     Allergies: Codeine and Methotrexate derivatives Current Outpatient Prescriptions on File Prior to Visit  Medication Sig Dispense Refill  . ALPRAZolam (XANAX) 0.5 MG tablet     . lisinopril-hydrochlorothiazide (PRINZIDE,ZESTORETIC) 20-25 MG per tablet Take 1 tablet by mouth daily.     No current facility-administered medications on file prior to visit.     Social History  Substance Use Topics  . Smoking status: Never Smoker  . Smokeless tobacco: Never Used  . Alcohol use 4.2 oz/week    3 Glasses of wine, 4 Cans of beer per week    Review of Systems    Constitutional: Negative for chills and fever.  Respiratory: Negative for cough.   Cardiovascular: Negative for chest pain and palpitations.  Gastrointestinal: Negative for nausea and vomiting.  Psychiatric/Behavioral: Positive for sleep disturbance. The patient is nervous/anxious.       Objective:    BP 120/78   Pulse 67   Temp 97.2 F (36.2 C) (Oral)   Resp 16   Ht 5' 9.25" (1.759 m)   Wt 176 lb (79.8 kg)   SpO2 97%   BMI 25.80 kg/m  BP Readings from Last 3 Encounters:  10/19/16 120/78  12/17/13 139/89  11/27/13 110/80   Wt Readings from Last 3 Encounters:  10/19/16 176 lb (79.8 kg)  12/17/13 171 lb 8 oz (77.8 kg)  11/27/13 175 lb (79.4 kg)    Physical Exam  Constitutional: She appears well-developed and well-nourished.  Eyes: Conjunctivae are normal.  Cardiovascular: Normal rate, regular rhythm, normal heart sounds and normal pulses.   Pulmonary/Chest: Effort normal and breath sounds normal. She has no wheezes. She has no rhonchi. She has no rales.  Neurological: She is alert.  Skin: Skin is warm and dry.  Psychiatric: She has a normal mood and affect. Her speech is normal and behavior is normal. Thought content normal.  Vitals reviewed.      Assessment &  Plan:   Problem List Items Addressed This Visit      Cardiovascular and Mediastinum   HTN (hypertension) - Primary    At goal. Continue current regimen      Relevant Medications   pravastatin (PRAVACHOL) 10 MG tablet   felodipine (PLENDIL) 5 MG 24 hr tablet     Other   HLD (hyperlipidemia)    Continue current regimen. Will check lipid panel at CPE.       Relevant Medications   pravastatin (PRAVACHOL) 10 MG tablet   felodipine (PLENDIL) 5 MG 24 hr tablet   Generalized anxiety disorder    Uncontrolled. Using xanax daily. Discussed risks of BZDs and patient agreed with long term plan of decreasing/discontinuing. Discussed zoloft and will likely try at next visit.            I have changed Ms.  Demby felodipine. I am also having her maintain her ALPRAZolam, lisinopril-hydrochlorothiazide, pravastatin, FLUARIX QUADRIVALENT, and naproxen sodium.   Meds ordered this encounter  Medications  . pravastatin (PRAVACHOL) 10 MG tablet    Sig: Take 1 tablet by mouth daily.  Marland Kitchen FLUARIX QUADRIVALENT 0.5 ML injection    Sig: TO BE ADMINISTERED BY PHARMACIST FOR IMMUNIZATION    Refill:  0  . naproxen sodium (ALEVE) 220 MG tablet    Sig: Take 220 mg by mouth 2 (two) times daily with a meal.  . felodipine (PLENDIL) 5 MG 24 hr tablet    Sig: Take 1 tablet (5 mg total) by mouth daily.    Dispense:  30 tablet    Refill:  3    Order Specific Question:   Supervising Provider    Answer:   Crecencio Mc [[id number]]    Return precautions given.   Risks, benefits, and alternatives of the medications and treatment plan prescribed today were discussed, and patient expressed understanding.   Education regarding symptom management and diagnosis given to patient on AVS.  Continue to follow with Mable Paris, FNP for routine health maintenance.   Danielle Humphrey and I agreed with plan.   Mable Paris, FNP  Total of 15 minutes spent with patient today, greater than 50% of which was spent in discussion of  Anxiety and risk of BZDs.

## 2016-10-30 ENCOUNTER — Telehealth: Payer: Self-pay | Admitting: Family

## 2016-10-30 NOTE — Telephone Encounter (Signed)
rec'd from [place] forward 9 pages to [place] FNP

## 2016-11-08 ENCOUNTER — Telehealth: Payer: Self-pay | Admitting: Family

## 2016-11-08 DIAGNOSIS — F411 Generalized anxiety disorder: Secondary | ICD-10-CM

## 2016-11-08 NOTE — Telephone Encounter (Signed)
Pt called and is requesting a refill on lisinopril-hydrochlorothiazide (PRINZIDE,ZESTORETIC) 20-25 MG per tablet and ALPRAZolam (XANAX) 0.5 MG tablet. Please advise, thank you!  [place], [place]  Call pt @ [phone]

## 2016-11-09 MED ORDER — LISINOPRIL-HYDROCHLOROTHIAZIDE 20-25 MG PO TABS: 1.0000 | ORAL_TABLET | Freq: Every day | ORAL | 1 refills | Status: DC

## 2016-11-09 MED ORDER — PRAVASTATIN SODIUM 10 MG PO TABS: 10.0000 mg | ORAL_TABLET | Freq: Every day | ORAL | 1 refills | Status: DC

## 2016-11-09 MED ORDER — ALPRAZOLAM 0.5 MG PO TABS: 0.5000 mg | ORAL_TABLET | Freq: Every day | ORAL | 0 refills | Status: DC | PRN

## 2016-11-09 NOTE — Telephone Encounter (Signed)
Pt called to follow up about her BP medication it's been 2 days that she has not had it. Thank you!

## 2016-11-09 NOTE — Telephone Encounter (Signed)
Other medication has been refilled. Patient only needs xanax.

## 2016-11-09 NOTE — Telephone Encounter (Signed)
Refill request for Xanax, last seen [id number], last filled 05/2014.  Please advise.

## 2017-01-15 ENCOUNTER — Other Ambulatory Visit: Payer: Self-pay | Admitting: Family

## 2017-01-15 DIAGNOSIS — F411 Generalized anxiety disorder: Secondary | ICD-10-CM

## 2017-01-15 NOTE — Telephone Encounter (Signed)
.  Refill request for Xanax, last seen [id number], last filled [id number].  Please advise.

## 2017-04-10 ENCOUNTER — Ambulatory Visit: Payer: 59 | Admitting: Family

## 2017-04-10 ENCOUNTER — Telehealth: Payer: Self-pay | Admitting: Family

## 2017-04-10 DIAGNOSIS — Z0289 Encounter for other administrative examinations: Secondary | ICD-10-CM

## 2017-04-10 NOTE — Telephone Encounter (Signed)
Cancelled appt, FYI

## 2017-04-10 NOTE — Telephone Encounter (Signed)
FYI - Pt cancelled appt, feeling better.

## 2017-04-24 ENCOUNTER — Other Ambulatory Visit (HOSPITAL_COMMUNITY)
Admission: RE | Admit: 2017-04-24 | Discharge: 2017-04-24 | Disposition: A | Discharge status: Home / Self Care | Payer: 59 | Source: Ambulatory Visit | Attending: Family | Admitting: Family

## 2017-04-24 ENCOUNTER — Encounter: Payer: Self-pay | Admitting: Family

## 2017-04-24 ENCOUNTER — Ambulatory Visit (INDEPENDENT_AMBULATORY_CARE_PROVIDER_SITE_OTHER): Payer: 59 | Admitting: Family

## 2017-04-24 VITALS — BP 130/80 | HR 62 | Temp 97.7°F | Ht 69.25 in | Wt 188.6 lb

## 2017-04-24 DIAGNOSIS — Z0000 Encounter for general adult medical examination without abnormal findings: Secondary | ICD-10-CM

## 2017-04-24 DIAGNOSIS — F411 Generalized anxiety disorder: Secondary | ICD-10-CM

## 2017-04-24 DIAGNOSIS — I10 Essential (primary) hypertension: Secondary | ICD-10-CM | POA: Diagnosis not present

## 2017-04-24 LAB — COMPREHENSIVE METABOLIC PANEL
ALK PHOS: 33 U/L — AB (ref 39–117)
ALT: 13 U/L (ref 0–35)
AST: 16 U/L (ref 0–37)
Albumin: 4.5 g/dL (ref 3.5–5.2)
BUN: 19 mg/dL (ref 6–23)
CALCIUM: 10 mg/dL (ref 8.4–10.5)
CO2: 27 meq/L (ref 19–32)
Chloride: 103 mEq/L (ref 96–112)
Creatinine, Ser: 0.74 mg/dL (ref 0.40–1.20)
GFR: 85.7 mL/min (ref >60.00)
GLUCOSE: 100 mg/dL — AB (ref 70–99)
Potassium: 3.8 mEq/L (ref 3.5–5.1)
Sodium: 138 mEq/L (ref 135–145)
TOTAL PROTEIN: 7.5 g/dL (ref 6.0–8.3)
Total Bilirubin: 0.6 mg/dL (ref 0.2–1.2)

## 2017-04-24 LAB — LIPID PANEL
CHOL/HDL RATIO: 5
Cholesterol: 246 mg/dL — ABNORMAL HIGH (ref 0–200)
HDL: 53.7 mg/dL (ref >39.00)
LDL Cholesterol: 159 mg/dL — ABNORMAL HIGH (ref 0–99)
NONHDL: 191.88
TRIGLYCERIDES: 165 mg/dL — AB (ref 0.0–149.0)
VLDL: 33 mg/dL (ref 0.0–40.0)

## 2017-04-24 LAB — CBC WITH DIFFERENTIAL/PLATELET
Basophils Absolute: 0 10*3/uL (ref 0.0–0.1)
Basophils Relative: 0.6 % (ref 0.0–3.0)
Eosinophils Absolute: 0.1 10*3/uL (ref 0.0–0.7)
Eosinophils Relative: 2 % (ref 0.0–5.0)
HCT: 42 % (ref 36.0–46.0)
Hemoglobin: 14 g/dL (ref 12.0–15.0)
Lymphocytes Relative: 41.3 % (ref 12.0–46.0)
Lymphs Abs: 2.9 10*3/uL (ref 0.7–4.0)
MCHC: 33.2 g/dL (ref 30.0–36.0)
MCV: 92.7 fl (ref 78.0–100.0)
MONOS PCT: 5.4 % (ref 3.0–12.0)
Monocytes Absolute: 0.4 10*3/uL (ref 0.1–1.0)
Neutro Abs: 3.6 10*3/uL (ref 1.4–7.7)
Neutrophils Relative %: 50.7 % (ref 43.0–77.0)
PLATELETS: 263 10*3/uL (ref 150.0–400.0)
RBC: 4.53 Mil/uL (ref 3.87–5.11)
RDW: 13 % (ref 11.5–15.5)
WBC: 7 10*3/uL (ref 4.0–10.5)

## 2017-04-24 LAB — VITAMIN D 25 HYDROXY (VIT D DEFICIENCY, FRACTURES): VITD: 31.44 ng/mL (ref 30.00–100.00)

## 2017-04-24 LAB — TSH: TSH: 2.65 u[IU]/mL (ref 0.35–4.50)

## 2017-04-24 LAB — HEMOGLOBIN A1C: Hgb A1c MFr Bld: 5.5 % (ref 4.6–6.5)

## 2017-04-24 NOTE — Assessment & Plan Note (Signed)
Clinical breast exam and Pap smear performed today. Patient is up-to-date on colonoscopy and mammogram. Screening labs ordered. Encouraged exercise. Referral dermatology for annual skin check.

## 2017-04-24 NOTE — Progress Notes (Signed)
Pre visit review using our clinic review tool, if applicable. No additional management support is needed unless otherwise documented below in the visit note. 

## 2017-04-24 NOTE — Patient Instructions (Signed)
Referral to derm  Labs today  Health Maintenance, Female Adopting a healthy lifestyle and getting preventive care can go a long way to promote health and wellness. Talk with your health care provider about what schedule of regular examinations is right for you. This is a good chance for you to check in with your provider about disease prevention and staying healthy. In between checkups, there are plenty of things you can do on your own. Experts have done a lot of research about which lifestyle changes and preventive measures are most likely to keep you healthy. Ask your health care provider for more information. Weight and diet Eat a healthy diet  Be sure to include plenty of vegetables, fruits, low-fat dairy products, and lean protein.  Do not eat a lot of foods high in solid fats, added sugars, or salt.  Get regular exercise. This is one of the most important things you can do for your health. ? Most adults should exercise for at least 150 minutes each week. The exercise should increase your heart rate and make you sweat (moderate-intensity exercise). ? Most adults should also do strengthening exercises at least twice a week. This is in addition to the moderate-intensity exercise.  Maintain a healthy weight  Body mass index (BMI) is a measurement that can be used to identify possible weight problems. It estimates body fat based on height and weight. Your health care provider can help determine your BMI and help you achieve or maintain a healthy weight.  For females 42 years of age and older: ? A BMI below 18.5 is considered underweight. ? A BMI of 18.5 to 24.9 is normal. ? A BMI of 25 to 29.9 is considered overweight. ? A BMI of 30 and above is considered obese.  Watch levels of cholesterol and blood lipids  You should start having your blood tested for lipids and cholesterol at 59 years of age, then have this test every 5 years.  You may need to have your cholesterol levels checked  more often if: ? Your lipid or cholesterol levels are high. ? You are older than 58 years of age. ? You are at high risk for heart disease.  Cancer screening Lung Cancer  Lung cancer screening is recommended for adults 20-87 years old who are at high risk for lung cancer because of a history of smoking.  A yearly low-dose CT scan of the lungs is recommended for people who: ? Currently smoke. ? Have quit within the past 15 years. ? Have at least a 30-pack-year history of smoking. A pack year is smoking an average of one pack of cigarettes a day for 1 year.  Yearly screening should continue until it has been 15 years since you quit.  Yearly screening should stop if you develop a health problem that would prevent you from having lung cancer treatment.  Breast Cancer  Practice breast self-awareness. This means understanding how your breasts normally appear and feel.  It also means doing regular breast self-exams. Let your health care provider know about any changes, no matter how small.  If you are in your 20s or 30s, you should have a clinical breast exam (CBE) by a health care provider every 1-3 years as part of a regular health exam.  If you are 20 or older, have a CBE every year. Also consider having a breast X-ray (mammogram) every year.  If you have a family history of breast cancer, talk to your health care provider about genetic screening.  If you are at high risk for breast cancer, talk to your health care provider about having an MRI and a mammogram every year.  Breast cancer gene (BRCA) assessment is recommended for women who have family members with BRCA-related cancers. BRCA-related cancers include: ? Breast. ? Ovarian. ? Tubal. ? Peritoneal cancers.  Results of the assessment will determine the need for genetic counseling and BRCA1 and BRCA2 testing.  Cervical Cancer Your health care provider may recommend that you be screened regularly for cancer of the pelvic  organs (ovaries, uterus, and vagina). This screening involves a pelvic examination, including checking for microscopic changes to the surface of your cervix (Pap test). You may be encouraged to have this screening done every 3 years, beginning at age 17.  For women ages 64-65, health care providers may recommend pelvic exams and Pap testing every 3 years, or they may recommend the Pap and pelvic exam, combined with testing for human papilloma virus (HPV), every 5 years. Some types of HPV increase your risk of cervical cancer. Testing for HPV may also be done on women of any age with unclear Pap test results.  Other health care providers may not recommend any screening for nonpregnant women who are considered low risk for pelvic cancer and who do not have symptoms. Ask your health care provider if a screening pelvic exam is right for you.  If you have had past treatment for cervical cancer or a condition that could lead to cancer, you need Pap tests and screening for cancer for at least 20 years after your treatment. If Pap tests have been discontinued, your risk factors (such as having a new sexual partner) need to be reassessed to determine if screening should resume. Some women have medical problems that increase the chance of getting cervical cancer. In these cases, your health care provider may recommend more frequent screening and Pap tests.  Colorectal Cancer  This type of cancer can be detected and often prevented.  Routine colorectal cancer screening usually begins at 58 years of age and continues through 58 years of age.  Your health care provider may recommend screening at an earlier age if you have risk factors for colon cancer.  Your health care provider may also recommend using home test kits to check for hidden blood in the stool.  A small camera at the end of a tube can be used to examine your colon directly (sigmoidoscopy or colonoscopy). This is done to check for the earliest forms  of colorectal cancer.  Routine screening usually begins at age 78.  Direct examination of the colon should be repeated every 5-10 years through 58 years of age. However, you may need to be screened more often if early forms of precancerous polyps or small growths are found.  Skin Cancer  Check your skin from head to toe regularly.  Tell your health care provider about any new moles or changes in moles, especially if there is a change in a mole's shape or color.  Also tell your health care provider if you have a mole that is larger than the size of a pencil eraser.  Always use sunscreen. Apply sunscreen liberally and repeatedly throughout the day.  Protect yourself by wearing long sleeves, pants, a wide-brimmed hat, and sunglasses whenever you are outside.  Heart disease, diabetes, and high blood pressure  High blood pressure causes heart disease and increases the risk of stroke. High blood pressure is more likely to develop in: ? People who have blood  pressure in the high end of the normal range (130-139/85-89 mm Hg). ? People who are overweight or obese. ? People who are African American.  If you are 88-21 years of age, have your blood pressure checked every 3-5 years. If you are 40 years of age or older, have your blood pressure checked every year. You should have your blood pressure measured twice-once when you are at a hospital or clinic, and once when you are not at a hospital or clinic. Record the average of the two measurements. To check your blood pressure when you are not at a hospital or clinic, you can use: ? An automated blood pressure machine at a pharmacy. ? A home blood pressure monitor.  If you are between 73 years and 50 years old, ask your health care provider if you should take aspirin to prevent strokes.  Have regular diabetes screenings. This involves taking a blood sample to check your fasting blood sugar level. ? If you are at a normal weight and have a low risk  for diabetes, have this test once every three years after 58 years of age. ? If you are overweight and have a high risk for diabetes, consider being tested at a younger age or more often. Preventing infection Hepatitis B  If you have a higher risk for hepatitis B, you should be screened for this virus. You are considered at high risk for hepatitis B if: ? You were born in a country where hepatitis B is common. Ask your health care provider which countries are considered high risk. ? Your parents were born in a high-risk country, and you have not been immunized against hepatitis B (hepatitis B vaccine). ? You have HIV or AIDS. ? You use needles to inject street drugs. ? You live with someone who has hepatitis B. ? You have had sex with someone who has hepatitis B. ? You get hemodialysis treatment. ? You take certain medicines for conditions, including cancer, organ transplantation, and autoimmune conditions.  Hepatitis C  Blood testing is recommended for: ? Everyone born from [birth year] through 1967. ? Anyone with known risk factors for hepatitis C.  Sexually transmitted infections (STIs)  You should be screened for sexually transmitted infections (STIs) including gonorrhea and chlamydia if: ? You are sexually active and are younger than 58 years of age. ? You are older than 58 years of age and your health care provider tells you that you are at risk for this type of infection. ? Your sexual activity has changed since you were last screened and you are at an increased risk for chlamydia or gonorrhea. Ask your health care provider if you are at risk.  If you do not have HIV, but are at risk, it may be recommended that you take a prescription medicine daily to prevent HIV infection. This is called pre-exposure prophylaxis (PrEP). You are considered at risk if: ? You are sexually active and do not regularly use condoms or know the HIV status of your partner(s). ? You take drugs by  injection. ? You are sexually active with a partner who has HIV.  Talk with your health care provider about whether you are at high risk of being infected with HIV. If you choose to begin PrEP, you should first be tested for HIV. You should then be tested every 3 months for as long as you are taking PrEP. Pregnancy  If you are premenopausal and you may become pregnant, ask your health care provider about preconception  counseling.  If you may become pregnant, take 400 to 800 micrograms (mcg) of folic acid every day.  If you want to prevent pregnancy, talk to your health care provider about birth control (contraception). Osteoporosis and menopause  Osteoporosis is a disease in which the bones lose minerals and strength with aging. This can result in serious bone fractures. Your risk for osteoporosis can be identified using a bone density scan.  If you are 54 years of age or older, or if you are at risk for osteoporosis and fractures, ask your health care provider if you should be screened.  Ask your health care provider whether you should take a calcium or vitamin D supplement to lower your risk for osteoporosis.  Menopause may have certain physical symptoms and risks.  Hormone replacement therapy may reduce some of these symptoms and risks. Talk to your health care provider about whether hormone replacement therapy is right for you. Follow these instructions at home:  Schedule regular health, dental, and eye exams.  Stay current with your immunizations.  Do not use any tobacco products including cigarettes, chewing tobacco, or electronic cigarettes.  If you are pregnant, do not drink alcohol.  If you are breastfeeding, limit how much and how often you drink alcohol.  Limit alcohol intake to no more than 1 drink per day for nonpregnant women. One drink equals 12 ounces of beer, 5 ounces of wine, or 1 ounces of hard liquor.  Do not use street drugs.  Do not share needles.  Ask  your health care provider for help if you need support or information about quitting drugs.  Tell your health care provider if you often feel depressed.  Tell your health care provider if you have ever been abused or do not feel safe at home. This information is not intended to replace advice given to you by your health care provider. Make sure you discuss any questions you have with your health care provider. Document Released: 03/27/2011 Document Revised: 02/17/2016 Document Reviewed: 06/15/2015 Elsevier Interactive Patient Education  [employer].

## 2017-04-24 NOTE — Assessment & Plan Note (Signed)
Controlled. Continue current regimen. 

## 2017-04-24 NOTE — Assessment & Plan Note (Addendum)
Controlled. Discussed again trialing SSRI however patient would like to consider that at another date. Patient understands risks of benzodiazepines. Will continue xanax for now.

## 2017-04-24 NOTE — Progress Notes (Signed)
Subjective:    Patient ID: Jearld Adjutant, female    DOB: 08-Dec-1960, 56 y.o.   MRN: [id number]  CC: Danielle Humphrey is a 56 y.o. female who presents today for physical exam.    HPI: HTN- compliant with medication. Denies exertional chest pain or pressure, numbness or tingling radiating to left arm or jaw, palpitations, dizziness, frequent headaches, changes in vision, or shortness of breath.   GAD- tried to come off the Xanax after we met in January however felt like her panic attacks increased. She is back to taking it once a day. No depression. No thoughts of hurting herself or anyone else.    Colorectal Cancer Screening: UTD 2014, repeat in 5 year Breast Cancer Screening: Mammogram done 07/2016, UTD Cervical Cancer Screening: due Bone Health screening/DEXA for 65+: No increased fracture risk. Defer screening at this time. Lung Cancer Screening: Doesn't have 30 year pack year history and age > 12 years.        Tetanus - UTD        Hepatitis C screening - Candidate for, declines HIV Screening- Candidate for , declines Labs: Screening labs today. Exercise: Gets regular exercise.  Alcohol use: Glass of wine every night.  Smoking/tobacco use: Nonsmoker.  Regular dental exams: utd Wears seat belt: Yes. Skin: no concerning lesions; no h/o skin cancer  HISTORY:  Past Medical History:  Diagnosis Date  . Chickenpox 1960  . Hypercholesteremia 2017  . Hypertension 1980  . Mitral valve prolapse 2016  . Shingles 2016    Past Surgical History:  Procedure Laterality Date  . 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 APPENDECTOMY Bilateral 1968  . BREAST LUMPECTOMY Bilateral 2005   Family History  Problem Relation Age of Onset  . Ovarian cancer Mother        31  . Breast cancer Mother        2005  . Dementia Mother   . Stroke Mother   . Lupus Mother   . Hypercholesterolemia Father   . Heart attack Father        50  . Hypertension Father   . Diabetes type II Sister     . Heart disease Brother       ALLERGIES: Codeine and Methotrexate derivatives  Current Outpatient Prescriptions on File Prior to Visit  Medication Sig Dispense Refill  . ALPRAZolam (XANAX) 0.5 MG tablet TAKE ONE (1) TABLET BY MOUTH DAILY AS NEEDED FOR ANXIETY 30 tablet 1  . felodipine (PLENDIL) 5 MG 24 hr tablet Take 1 tablet (5 mg total) by mouth daily. 30 tablet 3  . FLUARIX QUADRIVALENT 0.5 ML injection TO BE ADMINISTERED BY PHARMACIST FOR IMMUNIZATION  0  . lisinopril-hydrochlorothiazide (PRINZIDE,ZESTORETIC) 20-25 MG tablet Take 1 tablet by mouth daily. 90 tablet 1  . naproxen sodium (ALEVE) 220 MG tablet Take 220 mg by mouth 2 (two) times daily with a meal.    . pravastatin (PRAVACHOL) 10 MG tablet Take 1 tablet (10 mg total) by mouth daily. 90 tablet 1   No current facility-administered medications on file prior to visit.     Social History  Substance Use Topics  . Smoking status: Never Smoker  . Smokeless tobacco: Never Used  . Alcohol use 4.2 oz/week    3 Glasses of wine, 4 Cans of beer per week    Review of Systems  Constitutional: Negative for chills, fever and unexpected weight change.  HENT: Negative for congestion.   Respiratory: Negative for  cough.   Cardiovascular: Negative for chest pain, palpitations and leg swelling.  Gastrointestinal: Negative for nausea and vomiting.  Genitourinary: Negative for dyspareunia and pelvic pain.  Musculoskeletal: Negative for arthralgias and myalgias.  Skin: Negative for rash.  Neurological: Negative for headaches.  Hematological: Negative for adenopathy.  Psychiatric/Behavioral: Negative for confusion and suicidal ideas. The patient is nervous/anxious.       Objective:    BP 130/80   Pulse 62   Temp 97.7 F (36.5 C) (Oral)   Ht 5' 9.25" (1.759 m)   Wt 188 lb 9.6 oz (85.5 kg)   SpO2 99%   BMI 27.65 kg/m   BP Readings from Last 3 Encounters:  04/24/17 130/80  10/19/16 120/78  12/17/13 139/89   Wt Readings from  Last 3 Encounters:  04/24/17 188 lb 9.6 oz (85.5 kg)  10/19/16 176 lb (79.8 kg)  12/17/13 171 lb 8 oz (77.8 kg)    Physical Exam  Constitutional: She appears well-developed and well-nourished.  Eyes: Conjunctivae are normal.  Neck: No thyroid mass and no thyromegaly present.  Cardiovascular: Normal rate, regular rhythm, normal heart sounds and normal pulses.   Pulmonary/Chest: Effort normal and breath sounds normal. She has no wheezes. She has no rhonchi. She has no rales. Right breast exhibits no inverted nipple, no mass, no nipple discharge, no skin change and no tenderness. Left breast exhibits no inverted nipple, no mass, no nipple discharge, no skin change and no tenderness. Breasts are symmetrical.  No masses or asymmetry appreciated during CBE.  Genitourinary: Uterus is not enlarged, not fixed and not tender. Cervix exhibits no motion tenderness, no discharge and no friability. Right adnexum displays no mass, no tenderness and no fullness. Left adnexum displays no mass, no tenderness and no fullness.  Genitourinary Comments: Pap performed. No CMT. Unable to appreciated ovaries.  Lymphadenopathy:       Head (right side): No submental, no submandibular, no tonsillar, no preauricular, no posterior auricular and no occipital adenopathy present.       Head (left side): No submental, no submandibular, no tonsillar, no preauricular, no posterior auricular and no occipital adenopathy present.       Right cervical: No superficial cervical, no deep cervical and no posterior cervical adenopathy present.      Left cervical: No superficial cervical, no deep cervical and no posterior cervical adenopathy present.    She has no axillary adenopathy.       Right axillary: No pectoral and no lateral adenopathy present.       Left axillary: No pectoral and no lateral adenopathy present. Neurological: She is alert.  Skin: Skin is warm and dry.  Psychiatric: She has a normal mood and affect. Her speech is  normal and behavior is normal. Thought content normal.  Vitals reviewed.      Assessment & Plan:   Problem List Items Addressed This Visit      Cardiovascular and Mediastinum   HTN (hypertension)    Controlled. Continue current regimen        Other   Generalized anxiety disorder    Controlled. Discussed again trialing SSRI however patient would like to consider that at another date. Patient understands risks of benzodiazepines. Will continue xanax for now.       Routine physical examination - Primary    Clinical breast exam and Pap smear performed today. Patient is up-to-date on colonoscopy and mammogram. Screening labs ordered. Encouraged exercise. Referral dermatology for annual skin check.      Relevant  Orders   CBC with Differential/Platelet   Comprehensive metabolic panel   Hemoglobin A1c   Lipid panel   Cytology - PAP   TSH   VITAMIN D 25 Hydroxy (Vit-D Deficiency, Fractures)   Ambulatory referral to Dermatology       I am having Ms. Weissberg maintain her FLUARIX QUADRIVALENT, naproxen sodium, felodipine, lisinopril-hydrochlorothiazide, pravastatin, and ALPRAZolam.   No orders of the defined types were placed in this encounter.   Return precautions given.   Risks, benefits, and alternatives of the medications and treatment plan prescribed today were discussed, and patient expressed understanding.   Education regarding symptom management and diagnosis given to patient on AVS.   Continue to follow with Burnard Hawthorne, FNP for routine health maintenance.   Jearld Adjutant and I agreed with plan.   Mable Paris, FNP

## 2017-04-25 LAB — CYTOLOGY - PAP
Diagnosis: NEGATIVE
HPV: NOT DETECTED

## 2017-04-30 ENCOUNTER — Telehealth: Payer: Self-pay

## 2017-04-30 MED ORDER — PRAVASTATIN SODIUM 10 MG PO TABS: 10.0000 mg | ORAL_TABLET | Freq: Every day | ORAL | 1 refills | Status: DC

## 2017-04-30 NOTE — Telephone Encounter (Signed)
Medication has been refilled as per request. 

## 2017-04-30 NOTE — Telephone Encounter (Signed)
-----   Message from Burnard Hawthorne, [place] sent at 04/30/2017  2:25 PM EDT ----- Please notify patient the following; let me know if you cannot reach patient.    Danielle Humphrey,   Your labs came back, and overall they look great.   Normal pap smear.  Vitamin D is normal.  Cholesterol looks great. You are low cardiovascular risk. You may continue cholesterol medication and we will continue to follow.  Kidney and liver function are normal. White blood cell counts normal. No diabetes. Thyroid is also normal.    We will look forward to seeing you at follow up!  Joycelyn Schmid, NP

## 2017-05-07 ENCOUNTER — Other Ambulatory Visit: Payer: Self-pay | Admitting: Family

## 2017-05-07 DIAGNOSIS — F411 Generalized anxiety disorder: Secondary | ICD-10-CM

## 2017-05-08 ENCOUNTER — Telehealth: Payer: Self-pay | Admitting: Family

## 2017-05-08 NOTE — Telephone Encounter (Signed)
I looked up patient on [place] Controlled Substances Reporting System and saw no activity that raised concern of inappropriate use.   Refilled xanax

## 2017-05-21 ENCOUNTER — Ambulatory Visit (INDEPENDENT_AMBULATORY_CARE_PROVIDER_SITE_OTHER): Payer: 59

## 2017-05-21 ENCOUNTER — Ambulatory Visit (INDEPENDENT_AMBULATORY_CARE_PROVIDER_SITE_OTHER): Payer: 59 | Admitting: Family

## 2017-05-21 ENCOUNTER — Encounter: Payer: Self-pay | Admitting: Family

## 2017-05-21 VITALS — BP 152/78 | HR 66 | Temp 98.0°F | Ht 69.25 in | Wt 189.6 lb

## 2017-05-21 DIAGNOSIS — M25472 Effusion, left ankle: Secondary | ICD-10-CM

## 2017-05-21 LAB — CBC WITH DIFFERENTIAL/PLATELET
Basophils Absolute: 0 cells/uL (ref 0–200)
Basophils Relative: 0 %
Eosinophils Absolute: 148 cells/uL (ref 15–500)
Eosinophils Relative: 2 %
HCT: 41.6 % (ref 35.0–45.0)
Hemoglobin: 13.6 g/dL (ref 11.7–15.5)
LYMPHS PCT: 42 %
Lymphs Abs: 3108 cells/uL (ref 850–3900)
MCH: 30.3 pg (ref 27.0–33.0)
MCHC: 32.7 g/dL (ref 32.0–36.0)
MCV: 92.7 fL (ref 80.0–100.0)
MONO ABS: 518 {cells}/uL (ref 200–950)
MPV: 10.2 fL (ref 7.5–12.5)
Monocytes Relative: 7 %
NEUTROS PCT: 49 %
Neutro Abs: 3626 cells/uL (ref 1500–7800)
Platelets: 272 10*3/uL (ref 140–400)
RBC: 4.49 MIL/uL (ref 3.80–5.10)
RDW: 12.9 % (ref 11.0–15.0)
WBC: 7.4 10*3/uL (ref 3.8–10.8)

## 2017-05-21 NOTE — Progress Notes (Signed)
Pre visit review using our clinic review tool, if applicable. No additional management support is needed unless otherwise documented below in the visit note. 

## 2017-05-21 NOTE — Assessment & Plan Note (Signed)
On CCB

## 2017-05-21 NOTE — Patient Instructions (Signed)
Labs xray  Stay vigilant for any new, worsening symptoms  Will consider vascular if work up unrevealing

## 2017-05-21 NOTE — Progress Notes (Signed)
Subjective:    Patient ID: Danielle Humphrey, female    DOB: October 15, 1960, 56 y.o.   MRN: [id number]  CC: Danielle Humphrey is a 56 y.o. female who presents today for an acute visit.    HPI: CC: left ankle swollen x 3 weeks,waxing and waning.   Improves at night and then comes back during the day. Wears a lot of flat shoes.  Hurts to walk on it.   One day, states it felt 'hot to touch' 4 x days ago. No fever, N, V, rash, ticks, bug bites, leg swelling, sob.   No injury  Has been taking ibuprofen BID  No h/o gout  No recent travel, immobilization.             HISTORY:  Past Medical History:  Diagnosis Date  . Chickenpox 1960  . Hypercholesteremia 2017  . Hypertension 1980  . Mitral valve prolapse 2016  . Shingles 2016   Past Surgical History:  Procedure Laterality Date  . 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 APPENDECTOMY Bilateral 1968  . BREAST LUMPECTOMY Bilateral 2005   Family History  Problem Relation Age of Onset  . Ovarian cancer Mother        35  . Breast cancer Mother        2005  . Dementia Mother   . Stroke Mother   . Lupus Mother   . Hypercholesterolemia Father   . Heart attack Father        33  . Hypertension Father   . Diabetes type II Sister   . Heart disease Brother     Allergies: Codeine and Methotrexate derivatives Current Outpatient Prescriptions on File Prior to Visit  Medication Sig Dispense Refill  . ALPRAZolam (XANAX) 0.5 MG tablet TAKE ONE (1) TABLET BY MOUTH DAILY AS NEEDED FOR ANXIETY 30 tablet 1  . felodipine (PLENDIL) 5 MG 24 hr tablet Take 1 tablet (5 mg total) by mouth daily. 30 tablet 3  . FLUARIX QUADRIVALENT 0.5 ML injection TO BE ADMINISTERED BY PHARMACIST FOR IMMUNIZATION  0  . lisinopril-hydrochlorothiazide (PRINZIDE,ZESTORETIC) 20-25 MG tablet TAKE 1 TABLET BY MOUTH DAILY 90 tablet 1  . naproxen sodium (ALEVE) 220 MG tablet Take 220 mg by mouth 2 (two) times daily with a meal.    . pravastatin (PRAVACHOL) 10  MG tablet Take 1 tablet (10 mg total) by mouth daily. 90 tablet 1   No current facility-administered medications on file prior to visit.     Social History  Substance Use Topics  . Smoking status: Never Smoker  . Smokeless tobacco: Never Used  . Alcohol use 4.2 oz/week    3 Glasses of wine, 4 Cans of beer per week    Review of Systems  Constitutional: Negative for chills and fever.  Respiratory: Negative for cough and shortness of breath.   Cardiovascular: Negative for chest pain and palpitations.  Gastrointestinal: Negative for nausea and vomiting.  Musculoskeletal: Positive for joint swelling.  Skin: Negative for color change, rash and wound.      Objective:    BP (!) 152/78   Pulse 66   Temp 98 F (36.7 C) (Oral)   Ht 5' 9.25" (1.759 m)   Wt 189 lb 9.6 oz (86 kg)   SpO2 97%   BMI 27.80 kg/m    Physical Exam  Constitutional: She appears well-developed and well-nourished.  Eyes: Conjunctivae are normal.  Cardiovascular: Normal rate, regular rhythm, normal heart sounds and normal pulses.  No LE palpable cords or masses. No erythema or increased warmth. No asymmetry in calf size when compared bilaterally LE hair growth symmetric and present. No discoloration of varicosities noted. LE warm and palpable pedal pulses.   Pulmonary/Chest: Effort normal and breath sounds normal. She has no wheezes. She has no rhonchi. She has no rales.  Musculoskeletal:       Left ankle: She exhibits swelling. She exhibits normal range of motion, no ecchymosis, no laceration and normal pulse. No tenderness.       Feet:  Trace non pitting edema as noted on diagram.   No pain with Squeeze test at mid calf. Slight pain over lateral malleolus, medial malleolus. No pain over base of the fifth metatarsal or navicular bone.  No pain elicited with external rotation stress test. Mild pain with plantar and dorsi flex.  Sensation intact equally bilateral lower extremities. Palpable pedal pulses.  No rash, increased warmth, erythema, wound.   Neurological: She is alert.  Skin: Skin is warm and dry.  Psychiatric: She has a normal mood and affect. Her speech is normal and behavior is normal. Thought content normal.  Vitals reviewed.      Assessment & Plan:  1. Left ankle swelling Etiology of swelling is nonspecific at this time. Based duration of symptoms, working diagnoses include stress fracture, sprain, venous insufficiency. No portal of entry for infection or no gross symptoms of infection today. However based on duration of symptoms and since patient did report one day of joint feeling warm, pending blood cultures, inflammatory markers, XR to evaluate for osteomyelitis.    Advised very close vigilance and discussed all differentials with patient.   - Blood culture (routine single) - DG Ankle Complete Left - DG Foot Complete Left - Antinuclear Antib (ANA) - CBC w/Diff - Sed Rate (ESR) - C-reactive protein     I am having Ms. Galeno maintain her FLUARIX QUADRIVALENT, naproxen sodium, felodipine, pravastatin, lisinopril-hydrochlorothiazide, and ALPRAZolam.   No orders of the defined types were placed in this encounter.   Return precautions given.   Risks, benefits, and alternatives of the medications and treatment plan prescribed today were discussed, and patient expressed understanding.   Education regarding symptom management and diagnosis given to patient on AVS.  Continue to follow with Burnard Hawthorne, FNP for routine health maintenance.   Danielle Humphrey and I agreed with plan.   Mable Paris, FNP

## 2017-05-22 ENCOUNTER — Telehealth: Payer: Self-pay | Admitting: Family

## 2017-05-22 LAB — SEDIMENTATION RATE: Sed Rate: 1 mm/hr (ref 0–30)

## 2017-05-22 LAB — C-REACTIVE PROTEIN: CRP: 0.4 mg/L (ref <8.0)

## 2017-05-22 LAB — ANA: Anti Nuclear Antibody(ANA): NEGATIVE

## 2017-05-22 NOTE — Telephone Encounter (Signed)
Pt called back returning your call. Please advise, thank you!  Call pt @ [phone]

## 2017-05-22 NOTE — Telephone Encounter (Signed)
Left message for patient to return call back.  Labs and xray are normal. Please see result note.

## 2017-05-27 LAB — CULTURE, BLOOD (SINGLE): Organism ID, Bacteria: NO GROWTH

## 2017-06-20 ENCOUNTER — Other Ambulatory Visit: Payer: Self-pay | Admitting: Family

## 2017-06-20 DIAGNOSIS — I10 Essential (primary) hypertension: Secondary | ICD-10-CM

## 2017-08-29 ENCOUNTER — Other Ambulatory Visit: Payer: Self-pay | Admitting: Family

## 2017-08-29 DIAGNOSIS — F411 Generalized anxiety disorder: Secondary | ICD-10-CM

## 2017-08-30 NOTE — Telephone Encounter (Signed)
Signed and faxed

## 2017-08-30 NOTE — Telephone Encounter (Signed)
Printed,

## 2017-08-30 NOTE — Telephone Encounter (Signed)
Last OV 05/21/2017 Next OV none Last refill 07/02/2017

## 2017-10-17 ENCOUNTER — Other Ambulatory Visit: Payer: Self-pay | Admitting: Family

## 2017-10-17 DIAGNOSIS — I10 Essential (primary) hypertension: Secondary | ICD-10-CM

## 2017-11-07 ENCOUNTER — Other Ambulatory Visit: Payer: Self-pay | Admitting: Family

## 2017-11-09 ENCOUNTER — Telehealth: Payer: Self-pay | Admitting: Family

## 2017-11-09 MED ORDER — PRAVASTATIN SODIUM 10 MG PO TABS
10.0000 mg | ORAL_TABLET | Freq: Every day | ORAL | 1 refills | Status: DC
Start: 2017-11-09 — End: 2018-07-05

## 2017-11-09 MED ORDER — LISINOPRIL-HYDROCHLOROTHIAZIDE 20-25 MG PO TABS: 1.0000 | ORAL_TABLET | Freq: Every day | ORAL | 1 refills | Status: DC

## 2017-11-09 NOTE — Telephone Encounter (Signed)
pravastatin refill Last OV: 05/21/17 Last Refill:04/30/17 Pharmacy:[id number]  Lisinopril-HCTZ refill Last OV: 05/11/17  Last Refill:05/07/17 Pharmacy:

## 2017-11-09 NOTE — Telephone Encounter (Signed)
refilled 

## 2017-11-09 NOTE — Telephone Encounter (Signed)
Copied from [place]. Topic: Quick Communication - See Telephone Encounter >> Nov 09, 2017 12:20 PM [name], NT wrote: CRM for notification. See Telephone encounter for:  11/09/17.  Pharmacy has requested a refill on pravastatin and lisinopril-hydrochlorothiazide on 11/07/17. Patient states she has been out for 3 days and would like these refilled today if possible.   [id number] - [place], [place] [address]

## 2017-11-09 NOTE — Addendum Note (Signed)
Addended by: Carlisle Beers on: 11/09/2017 01:39 PM   Modules accepted: Orders

## 2017-12-25 ENCOUNTER — Other Ambulatory Visit: Payer: Self-pay | Admitting: Family

## 2017-12-25 DIAGNOSIS — Z1231 Encounter for screening mammogram for malignant neoplasm of breast: Secondary | ICD-10-CM

## 2018-01-09 ENCOUNTER — Ambulatory Visit
Admission: RE | Admit: 2018-01-09 | Discharge: 2018-01-09 | Disposition: A | Discharge status: Home / Self Care | Payer: 59 | Source: Ambulatory Visit | Attending: Family | Admitting: Family

## 2018-01-09 DIAGNOSIS — Z1231 Encounter for screening mammogram for malignant neoplasm of breast: Secondary | ICD-10-CM

## 2018-01-10 ENCOUNTER — Other Ambulatory Visit: Payer: Self-pay | Admitting: Family

## 2018-01-10 DIAGNOSIS — I10 Essential (primary) hypertension: Secondary | ICD-10-CM

## 2018-01-17 ENCOUNTER — Ambulatory Visit: Payer: 59

## 2018-02-06 ENCOUNTER — Other Ambulatory Visit: Payer: Self-pay | Admitting: Internal Medicine

## 2018-02-06 DIAGNOSIS — F411 Generalized anxiety disorder: Secondary | ICD-10-CM

## 2018-02-06 NOTE — Telephone Encounter (Signed)
Refilled: 08/30/2017 Last OV: 05/21/2017 Next OV: not scheduled

## 2018-02-06 NOTE — Telephone Encounter (Signed)
Call pt  She is due for follow up with me. She has been seen since 04/2017  I refilled out of courtesy however she will need OV for any more refills  I looked up patient on [place] Controlled Substances Reporting System and saw no activity that raised concern of inappropriate use.

## 2018-02-21 NOTE — Telephone Encounter (Signed)
Appointment scheduled.

## 2018-03-19 ENCOUNTER — Other Ambulatory Visit: Payer: Self-pay | Admitting: Family

## 2018-03-19 DIAGNOSIS — I10 Essential (primary) hypertension: Secondary | ICD-10-CM

## 2018-04-03 ENCOUNTER — Encounter: Payer: Self-pay | Admitting: Family

## 2018-04-03 ENCOUNTER — Ambulatory Visit: Payer: No Typology Code available for payment source | Admitting: Family

## 2018-04-03 VITALS — BP 130/100 | HR 74 | Temp 98.6°F | Wt 197.4 lb

## 2018-04-03 DIAGNOSIS — E785 Hyperlipidemia, unspecified: Secondary | ICD-10-CM

## 2018-04-03 DIAGNOSIS — I10 Essential (primary) hypertension: Secondary | ICD-10-CM | POA: Diagnosis not present

## 2018-04-03 DIAGNOSIS — F411 Generalized anxiety disorder: Secondary | ICD-10-CM | POA: Diagnosis not present

## 2018-04-03 MED ORDER — ALPRAZOLAM 0.5 MG PO TABS: ORAL_TABLET | ORAL | 1 refills | Status: DC

## 2018-04-03 NOTE — Progress Notes (Signed)
Subjective:    Patient ID: Danielle Humphrey, female    DOB: 04-Nov-1960, 57 y.o.   MRN: [id number]  CC: TAMIKO LEOPARD is a 57 y.o. female who presents today for follow up.   HPI: HTN-taking 1/2 feldipine. Not checking blood pressure routinely at home. Denies exertional chest pain or pressure, numbness or tingling radiating to left arm or jaw, palpitations, dizziness, frequent headaches, changes in vision, or shortness of breath.    GAD- needs refill of xanax for sleep. Unchanged.   HLD- stopped taking pravastatin due to myalgia.  Limiting red meat, eating lots of fish.   Has lost 11 pounds intentionally with walking. Doing weight watchers     HISTORY:  Past Medical History:  Diagnosis Date  . Chickenpox 1960  . Hypercholesteremia 2017  . Hypertension 1980  . Mitral valve prolapse 2016  . Shingles 2016   Past Surgical History:  Procedure Laterality Date  . 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 APPENDECTOMY Bilateral 1968  . BREAST EXCISIONAL BIOPSY Bilateral 2005   benign  . BREAST LUMPECTOMY Bilateral 2005   Family History  Problem Relation Age of Onset  . Ovarian cancer Mother        46  . Breast cancer Mother        2005  . Dementia Mother   . Stroke Mother   . Lupus Mother   . Hypercholesterolemia Father   . Heart attack Father        32  . Hypertension Father   . Diabetes type II Sister   . Heart disease Brother     Allergies: Codeine and Methotrexate derivatives Current Outpatient Medications on File Prior to Visit  Medication Sig Dispense Refill  . felodipine (PLENDIL) 5 MG 24 hr tablet TAKE 1 TABLET BY MOUTH EVERY DAY (Patient taking differently: TAKE 1 TABLET BY MOUTH EVERY DAY, takes 1/2 tablet daily) 30 tablet 0  . lisinopril-hydrochlorothiazide (PRINZIDE,ZESTORETIC) 20-25 MG tablet Take 1 tablet by mouth daily. 90 tablet 1  . naproxen sodium (ALEVE) 220 MG tablet Take 220 mg by mouth 2 (two) times daily with a meal.    . pravastatin  (PRAVACHOL) 10 MG tablet Take 1 tablet (10 mg total) by mouth daily. 90 tablet 1   No current facility-administered medications on file prior to visit.     Social History   Tobacco Use  . Smoking status: Never Smoker  . Smokeless tobacco: Never Used  Substance Use Topics  . Alcohol use: Yes    Alcohol/week: 4.2 oz    Types: 3 Glasses of wine, 4 Cans of beer per week  . Drug use: No    Review of Systems  Constitutional: Negative for chills and fever.  Respiratory: Negative for cough.   Cardiovascular: Negative for chest pain and palpitations.  Gastrointestinal: Negative for nausea and vomiting.  Psychiatric/Behavioral: The patient is nervous/anxious.       Objective:    BP (!) 130/100 (BP Location: Left Arm, Patient Position: Sitting, Cuff Size: Normal)   Pulse 74   Temp 98.6 F (37 C) (Oral)   Wt 197 lb 6 oz (89.5 kg)   SpO2 98%   BMI 28.94 kg/m  BP Readings from Last 3 Encounters:  04/03/18 (!) 130/100  05/21/17 (!) 152/78  04/24/17 130/80   Wt Readings from Last 3 Encounters:  04/03/18 197 lb 6 oz (89.5 kg)  05/21/17 189 lb 9.6 oz (86 kg)  04/24/17 188 lb 9.6 oz (85.5  kg)    Physical Exam  Constitutional: She appears well-developed and well-nourished.  Eyes: Conjunctivae are normal.  Cardiovascular: Normal rate, regular rhythm, normal heart sounds and normal pulses.  Pulmonary/Chest: Effort normal and breath sounds normal. She has no wheezes. She has no rhonchi. She has no rales.  Neurological: She is alert.  Skin: Skin is warm and dry.  Psychiatric: She has a normal mood and affect. Her speech is normal and behavior is normal. Thought content normal.  Vitals reviewed.      Assessment & Plan:   Problem List Items Addressed This Visit      Cardiovascular and Mediastinum   HTN (hypertension) - Primary    Diastolic blood pressure elevated today.  Patient will increase to full tab a calcium channel blocker.  She will monitor blood pressures at home and  let me know how she is doing.         Other   HLD (hyperlipidemia)    Prefers to treat with dietary modifications, exercise.  We will monitor lipid panel at follow-up.      Generalized anxiety disorder    Stable, unchanged. Refilled. I looked up patient on [place] Controlled Substances Reporting System and saw no activity that raised concern of inappropriate use.        Relevant Medications   ALPRAZolam (XANAX) 0.5 MG tablet       I have discontinued Kristine Royal. [place]'s FLUARIX QUADRIVALENT. I am also having her maintain her naproxen sodium, lisinopril-hydrochlorothiazide, pravastatin, felodipine, and ALPRAZolam.   Meds ordered this encounter  Medications  . ALPRAZolam (XANAX) 0.5 MG tablet    Sig: TAKE ONE TABLET BY MOUTH ONCE DAILY AS NEEDED FOR ANXIETY    Dispense:  30 tablet    Refill:  1    Not to exceed 5 additional fills before 06/04/2019NEEDS REFILLS.    Return precautions given.   Risks, benefits, and alternatives of the medications and treatment plan prescribed today were discussed, and patient expressed understanding.   Education regarding symptom management and diagnosis given to patient on AVS.  Continue to follow with Burnard Hawthorne, FNP for routine health maintenance.   Danielle Humphrey and I agreed with plan.   Mable Paris, FNP

## 2018-04-03 NOTE — Assessment & Plan Note (Addendum)
Stable, unchanged. Refilled. I looked up patient on  Controlled Substances Reporting System and saw no activity that raised concern of inappropriate use.

## 2018-04-03 NOTE — Patient Instructions (Addendum)
Monitor blood pressure,  Goal is less than 130/80; if persistently higher, please make sooner follow up appointment so we can recheck you blood pressure and manage medications  Start taking 5mg  feldipine.

## 2018-04-05 NOTE — Assessment & Plan Note (Signed)
Diastolic blood pressure elevated today.  Patient will increase to full tab a calcium channel blocker.  She will monitor blood pressures at home and let me know how she is doing.

## 2018-04-05 NOTE — Assessment & Plan Note (Signed)
Prefers to treat with dietary modifications, exercise.  We will monitor lipid panel at follow-up.

## 2018-04-17 ENCOUNTER — Other Ambulatory Visit: Payer: Self-pay | Admitting: Family

## 2018-04-17 DIAGNOSIS — I10 Essential (primary) hypertension: Secondary | ICD-10-CM

## 2018-04-18 NOTE — Telephone Encounter (Signed)
Last office visit patient stated she was taking 1/2 tablet . ? 1 tablet qd

## 2018-04-18 NOTE — Telephone Encounter (Signed)
You are correct- she was taking half tab however her BP was high at last visit and we discussed her taking whole tab  Would you call her and ensure her BP has improved ?

## 2018-05-02 ENCOUNTER — Other Ambulatory Visit: Payer: Self-pay | Admitting: Family

## 2018-05-02 NOTE — Telephone Encounter (Signed)
Spoke with patient blood pressure averaging around 120-130/80 since taking 1 pill of felodipine

## 2018-06-28 ENCOUNTER — Encounter: Payer: Self-pay | Admitting: Family

## 2018-06-28 ENCOUNTER — Ambulatory Visit: Payer: No Typology Code available for payment source | Admitting: Family

## 2018-06-28 ENCOUNTER — Telehealth: Payer: Self-pay | Admitting: Family

## 2018-06-28 VITALS — BP 130/90 | HR 82 | Temp 98.1°F | Wt 197.4 lb

## 2018-06-28 DIAGNOSIS — R232 Flushing: Secondary | ICD-10-CM | POA: Diagnosis not present

## 2018-06-28 DIAGNOSIS — F322 Major depressive disorder, single episode, severe without psychotic features: Secondary | ICD-10-CM

## 2018-06-28 DIAGNOSIS — I10 Essential (primary) hypertension: Secondary | ICD-10-CM | POA: Diagnosis not present

## 2018-06-28 MED ORDER — SERTRALINE HCL 50 MG PO TABS
50.0000 mg | ORAL_TABLET | Freq: Every day | ORAL | 3 refills | Status: DC
Start: 2018-06-28 — End: 2018-08-16

## 2018-06-28 NOTE — Assessment & Plan Note (Signed)
Overall controlled. Will continue regimen for now

## 2018-06-28 NOTE — Progress Notes (Signed)
Subjective:    Patient ID: Danielle Humphrey, female    DOB: 07-18-1961, 57 y.o.   MRN: [id number]  CC: Danielle Humphrey is a 57 y.o. female who presents today for follow up.   HPI: Complains of depression, anxiety x 6 months.   Tearful today.  Stress at work, uncomfortable with [employer] and not getting help to perform duties . Employer was noght two years ago  Cannot make decisions, trouble remembering. Trouble staying asleep Takes half xanax to fall asleep.  Panic attacks awakening her at night.   Drinking alcohol to 'self  Medicate'.  Some nights it is an entire bottle.   Has done counseling in the past which was helpful .  Will message prior and let me know if needs referral. Had been on paxil many years ago, didn't like it.  Children doing well. Family supportive. So much good in my life. Son is having a baby. 'So excited about life.' No si/hi.   Personal  life is 'doing great.'   Menopasual x 5 years. 'Hot flashes' have returned. No vaginal bleeding. No bone pain, unintentional weight loss.    Will return for CPE in 2 weeks.     HISTORY:  Past Medical History:  Diagnosis Date  . Chickenpox 1960  . Hypercholesteremia 2017  . Hypertension 1980  . Mitral valve prolapse 2016  . Shingles 2016   Past Surgical History:  Procedure Laterality Date  . 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 APPENDECTOMY Bilateral 1968  . BREAST EXCISIONAL BIOPSY Bilateral 2005   benign  . BREAST LUMPECTOMY Bilateral 2005   Family History  Problem Relation Age of Onset  . Ovarian cancer Mother        42  . Breast cancer Mother        2005  . Dementia Mother   . Stroke Mother   . Lupus Mother   . Hypercholesterolemia Father   . Heart attack Father        41  . Hypertension Father   . Diabetes type II Sister   . Heart disease Brother     Allergies: Codeine and Methotrexate derivatives Current Outpatient Medications on File Prior to Visit  Medication Sig Dispense Refill  .  ALPRAZolam (XANAX) 0.5 MG tablet TAKE ONE TABLET BY MOUTH ONCE DAILY AS NEEDED FOR ANXIETY 30 tablet 1  . felodipine (PLENDIL) 5 MG 24 hr tablet TAKE 1 TABLET BY MOUTH EVERY DAY 90 tablet 1  . lisinopril-hydrochlorothiazide (PRINZIDE,ZESTORETIC) 20-25 MG tablet TAKE 1 TABLET BY MOUTH EVERY DAY 90 tablet 0  . pravastatin (PRAVACHOL) 10 MG tablet Take 1 tablet (10 mg total) by mouth daily. (Patient not taking: Reported on 06/28/2018) 90 tablet 1   No current facility-administered medications on file prior to visit.     Social History   Tobacco Use  . Smoking status: Never Smoker  . Smokeless tobacco: Never Used  Substance Use Topics  . Alcohol use: Yes    Alcohol/week: 7.0 standard drinks    Types: 3 Glasses of wine, 4 Cans of beer per week  . Drug use: No    Review of Systems  Constitutional: Negative for chills, fever and unexpected weight change.  Respiratory: Negative for cough.   Cardiovascular: Negative for chest pain and palpitations.  Gastrointestinal: Negative for nausea and vomiting.  Genitourinary: Negative for vaginal bleeding.  Psychiatric/Behavioral: Positive for sleep disturbance. Negative for suicidal ideas. The patient is nervous/anxious.  Objective:    BP 130/90   Pulse 82   Temp 98.1 F (36.7 C) (Oral)   Wt 197 lb 6 oz (89.5 kg)   SpO2 98%   BMI 28.94 kg/m  BP Readings from Last 3 Encounters:  06/28/18 130/90  04/03/18 (!) 130/100  05/21/17 (!) 152/78   Wt Readings from Last 3 Encounters:  06/28/18 197 lb 6 oz (89.5 kg)  04/03/18 197 lb 6 oz (89.5 kg)  05/21/17 189 lb 9.6 oz (86 kg)    Physical Exam  Constitutional: She appears well-developed and well-nourished.  Eyes: Conjunctivae are normal.  Cardiovascular: Normal rate, regular rhythm, normal heart sounds and normal pulses.  Pulmonary/Chest: Effort normal and breath sounds normal. She has no wheezes. She has no rhonchi. She has no rales.  Neurological: She is alert.  Skin: Skin is warm  and dry.  Psychiatric: She has a normal mood and affect. Her speech is normal and behavior is normal. Thought content normal.  Vitals reviewed.      Assessment & Plan:   Problem List Items Addressed This Visit      Cardiovascular and Mediastinum   HTN (hypertension)    Overall controlled. Will continue regimen for now      Hot flashes    New ( to me) . Declines labs today as we will do labs to CPE next week.         Other   Depression, major, single episode, severe ([place]) - Primary    Trial of zoloft. Long discussion regarding alcohol use. Advised no more than one glass of wine per night. We will also titrate off the xanax as SSRI far safer, more effective.Referral to counseling. Close f/u as patient returns next week. She will also bring FMLA paperwork in as I think a leave of absence appropriate. She verbalized understanding of all.       Relevant Medications   sertraline (ZOLOFT) 50 MG tablet       I have discontinued Kristine Royal. Bache's naproxen sodium. I am also having her start on sertraline. Additionally, I am having her maintain her pravastatin, ALPRAZolam, felodipine, and lisinopril-hydrochlorothiazide.   Meds ordered this encounter  Medications  . sertraline (ZOLOFT) 50 MG tablet    Sig: Take 1 tablet (50 mg total) by mouth at bedtime.    Dispense:  90 tablet    Refill:  3    Order Specific Question:   Supervising Provider    Answer:   Crecencio Mc [[id number]]    Return precautions given.   Risks, benefits, and alternatives of the medications and treatment plan prescribed today were discussed, and patient expressed understanding.   Education regarding symptom management and diagnosis given to patient on AVS.  Continue to follow with Burnard Hawthorne, FNP for routine health maintenance.   Danielle Humphrey and I agreed with plan.   Mable Paris, FNP

## 2018-06-28 NOTE — Patient Instructions (Addendum)
Start zoloft at bedtime. We likely will increase this dose  Take 0.25mg  xanax every OTHER night. We want to use less and less of this as the zoloft gets started.  Really cut down on alcohol use and ask your family to support you here  Please bring paperwork by  Limit antiinflammatories on Zoloft as we discussed  Our hope is for gradual improvement of mood since starting medication; however this may take several weeks.   If you start to have unusual thoughts, thoughts of hurting yourself, or anyone else, please go immediately to the emergency department.   Follow up 2 weeks at physical  National Suicide Prevention Hotline - available 24 hours a day, 7 days a week.  (680)794-8499  Major Depressive Disorder Major depressive disorder is a mental illness. It also may be called clinical depression or unipolar depression. Major depressive disorder usually causes feelings of sadness, hopelessness, or helplessness. Some people with this disorder do not feel particularly sad but lose interest in doing things they used to enjoy (anhedonia). Major depressive disorder also can cause physical symptoms. It can interfere with work, school, relationships, and other normal everyday activities. The disorder varies in severity but is longer lasting and more serious than the sadness we all feel from time to time in our lives. Major depressive disorder often is triggered by stressful life events or major life changes. Examples of these triggers include divorce, loss of your job or home, a move, and the death of a family member or close friend. Sometimes this disorder occurs for no obvious reason at all. People who have family members with major depressive disorder or bipolar disorder are at higher risk for developing this disorder, with or without life stressors. Major depressive disorder can occur at any age. It may occur just once in your life (single episode major depressive disorder). It may occur multiple times  (recurrent major depressive disorder). SYMPTOMS People with major depressive disorder have either anhedonia or depressed mood on nearly a daily basis for at least 2 weeks or longer. Symptoms of depressed mood include:  Feelings of sadness (blue or down in the dumps) or emptiness.  Feelings of hopelessness or helplessness.  Tearfulness or episodes of crying (may be observed by others).  Irritability (children and adolescents). In addition to depressed mood or anhedonia or both, people with this disorder have at least four of the following symptoms:  Difficulty sleeping or sleeping too much.   Significant change (increase or decrease) in appetite or weight.   Lack of energy or motivation.  Feelings of guilt and worthlessness.   Difficulty concentrating, remembering, or making decisions.  Unusually slow movement (psychomotor retardation) or restlessness (as observed by others).   Recurrent wishes for death, recurrent thoughts of self-harm (suicide), or a suicide attempt. People with major depressive disorder commonly have persistent negative thoughts about themselves, other people, and the world. People with severe major depressive disorder may experiencedistorted beliefs or perceptions about the world (psychotic delusions). They also may see or hear things that are not real (psychotic hallucinations). DIAGNOSIS Major depressive disorder is diagnosed through an assessment by your health care provider. Your health care provider will ask aboutaspects of your daily life, such as mood,sleep, and appetite, to see if you have the diagnostic symptoms of major depressive disorder. Your health care provider may ask about your medical history and use of alcohol or drugs, including prescription medicines. Your health care provider also may do a physical exam and blood work. This is because  certain medical conditions and the use of certain substances can cause major depressive disorder-like  symptoms (secondary depression). Your health care provider also may refer you to a mental health specialist for further evaluation and treatment. TREATMENT It is important to recognize the symptoms of major depressive disorder and seek treatment. The following treatments can be prescribed for this disorder:   Medicine. Antidepressant medicines usually are prescribed. Antidepressant medicines are thought to correct chemical imbalances in the brain that are commonly associated with major depressive disorder. Other types of medicine may be added if the symptoms do not respond to antidepressant medicines alone or if psychotic delusions or hallucinations occur.  Talk therapy. Talk therapy can be helpful in treating major depressive disorder by providing support, education, and guidance. Certain types of talk therapy also can help with negative thinking (cognitive behavioral therapy) and with relationship issues that trigger this disorder (interpersonal therapy). A mental health specialist can help determine which treatment is best for you. Most people with major depressive disorder do well with a combination of medicine and talk therapy. Treatments involving electrical stimulation of the brain can be used in situations with extremely severe symptoms or when medicine and talk therapy do not work over time. These treatments include electroconvulsive therapy, transcranial magnetic stimulation, and vagal nerve stimulation.   This information is not intended to replace advice given to you by your health care provider. Make sure you discuss any questions you have with your health care provider.   Document Released: 01/06/2013 Document Revised: 10/02/2014 Document Reviewed: 01/06/2013 Elsevier Interactive Patient Education [employer].      .

## 2018-06-28 NOTE — Progress Notes (Signed)
Subjective:    Patient ID: Danielle Humphrey, female    DOB: 04-09-1961, 57 y.o.   MRN: [id number]  CC: Danielle Humphrey is a 57 y.o. female who presents today for physical exam.    HPI: Anxiety and depression- felt better after 2 days on zoloft. Sleeping has improved. Taking xanax QOD ( intentional decrease from daily). No si/hi. Hasnt had any wine to drink since started on zoloft.  Thinks has been using to medicate anxiety.   Would like referral counseling.   Hot flashes have improved on zoloft.   HLD- not on pravastatin.   HTN- compliant Humphrey medications. At home 130/80 to 140/90. Denies exertional chest pain or pressure, numbness or tingling radiating to left arm or jaw, palpitations, dizziness, frequent headaches, changes in vision, or shortness of breath.      h/o mitral valve regurgitation- 2015. Normal EF.  no dizziness.   Colorectal Cancer Screening: due Breast Cancer Screening: Mammogram UTD Cervical Cancer Screening: UTD. Family h/o breast cancer Bone Health screening/DEXA for 65+: No increased fracture risk. Defer screening at this time. Lung Cancer Screening: Doesn't have 30 year pack year history and age > 27 years.       Tetanus - utd        Hepatitis C screening - Candidate for, declines HIV Screening- Candidate for,declines Labs: Screening labs today. Exercise: Gets regular exercise.  Alcohol use: Drinking less.  Smoking/tobacco use: Nonsmoker.  Regular dental exams: In need of dental exam. Wears seat belt: Yes. Skin: prior sun exposure. No h/o skin cancer  HISTORY:  Past Medical History:  Diagnosis Date  . Chickenpox 1960  . Hypercholesteremia 2017  . Hypertension 1980  . Mitral valve prolapse 2016  . Shingles 2016    Past Surgical History:  Procedure Laterality Date  . 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 APPENDECTOMY Bilateral 1968  . BREAST EXCISIONAL BIOPSY Bilateral 2005   benign  . BREAST LUMPECTOMY Bilateral 2005   Family History    Problem Relation Age of Onset  . Breast cancer Mother 9       2005  . Dementia Mother   . Stroke Mother   . Lupus Mother   . Hypercholesterolemia Father   . Heart attack Father        46  . Hypertension Father   . Diabetes type II Sister   . Heart disease Brother   . Ovarian cancer Neg Hx       ALLERGIES: Codeine and Methotrexate derivatives  Current Outpatient Medications on File Prior to Visit  Medication Sig Dispense Refill  . ALPRAZolam (XANAX) 0.5 MG tablet TAKE ONE TABLET BY MOUTH ONCE DAILY AS NEEDED FOR ANXIETY 30 tablet 1  . lisinopril-hydrochlorothiazide (PRINZIDE,ZESTORETIC) 20-25 MG tablet TAKE 1 TABLET BY MOUTH EVERY DAY 90 tablet 0  . sertraline (ZOLOFT) 50 MG tablet Take 1 tablet (50 mg total) by mouth at bedtime. 90 tablet 3   No current facility-administered medications on file prior to visit.     Social History   Tobacco Use  . Smoking status: Never Smoker  . Smokeless tobacco: Never Used  Substance Use Topics  . Alcohol use: Yes    Alcohol/week: 7.0 standard drinks    Types: 3 Glasses of wine, 4 Cans of beer per week  . Drug use: No    Review of Systems  Constitutional: Negative for chills, fever and unexpected weight change.  HENT: Negative for congestion.   Respiratory: Negative for  cough and shortness of breath.   Cardiovascular: Negative for chest pain, palpitations and leg swelling.  Gastrointestinal: Negative for nausea and vomiting.  Genitourinary: Negative for vaginal bleeding and vaginal discharge.  Musculoskeletal: Negative for arthralgias and myalgias.  Skin: Negative for rash.  Neurological: Negative for headaches.  Hematological: Negative for adenopathy.  Psychiatric/Behavioral: Negative for confusion, sleep disturbance and suicidal ideas. The patient is nervous/anxious (improved).       Objective:    BP 138/84 (BP Location: Left Arm, Patient Position: Sitting, Cuff Size: Normal)   Pulse 64   Temp 98.5 F (36.9 C) (Oral)    Resp 14   Ht 5\' 9"  (1.753 m)   Wt 197 lb 8 oz (89.6 kg)   SpO2 97%   BMI 29.17 kg/m   BP Readings from Last 3 Encounters:  07/05/18 138/84  06/28/18 130/90  04/03/18 (!) 130/100   Wt Readings from Last 3 Encounters:  07/05/18 197 lb 8 oz (89.6 kg)  06/28/18 197 lb 6 oz (89.5 kg)  04/03/18 197 lb 6 oz (89.5 kg)    Physical Exam  Constitutional: She appears well-developed and well-nourished.  Eyes: Conjunctivae are normal.  Neck: No thyroid mass and no thyromegaly present.  Cardiovascular: Normal rate, regular rhythm, normal heart sounds and normal pulses.  Pulmonary/Chest: Effort normal and breath sounds normal. She has no wheezes. She has no rhonchi. She has no rales. Right breast exhibits no inverted nipple, no mass, no nipple discharge, no skin change and no tenderness. Left breast exhibits no inverted nipple, no mass, no nipple discharge, no skin change and no tenderness. Breasts are symmetrical.  CBE performed.   Lymphadenopathy:       Head (right side): No submental, no submandibular, no tonsillar, no preauricular, no posterior auricular and no occipital adenopathy present.       Head (left side): No submental, no submandibular, no tonsillar, no preauricular, no posterior auricular and no occipital adenopathy present.    She has no cervical adenopathy.       Right cervical: No superficial cervical, no deep cervical and no posterior cervical adenopathy present.      Left cervical: No superficial cervical, no deep cervical and no posterior cervical adenopathy present.    She has no axillary adenopathy.  Neurological: She is alert.  Skin: Skin is warm and dry.  Psychiatric: She has a normal mood and affect. Her speech is normal and behavior is normal. Thought content normal.  Laughing, smiling Appropriately dressed  Vitals reviewed.      Assessment & Plan:   Problem List Items Addressed This Visit      Cardiovascular and Mediastinum   HTN (hypertension)     Uncontrolled. Increased CCB. Patient will monitor BP at home and let me know if not at goal.      Relevant Medications   felodipine (PLENDIL) 10 MG 24 hr tablet   Hot flashes    Improved on zoloft. Will follow      Relevant Medications   felodipine (PLENDIL) 10 MG 24 hr tablet   Mitral valve regurgitation    Asymptomatic. Education provided on letting me know of any symptoms. Per recommendations, we will repeat echo 2020 ( last done in 2015) since appearing mild and she is asymptomatic.       Relevant Medications   felodipine (PLENDIL) 10 MG 24 hr tablet     Other   Generalized anxiety disorder    Pleased to see how well  Patient is doing today.  She states that she is feeling much better on the Zoloft, and her appearance today is indicative of that.  We will continue Zoloft this time.  Referral to counseling placed as well.  Patient is currently on leave of absence for work.  She will notify me if she does not continue to feel better and also when she is ready to return to work.      Relevant Orders   Ambulatory referral to Psychology   Routine physical examination - Primary    Clinical breast exam performed today.  Deferred pelvic exam as Pap smear is up-to-date and she has no complaints today.  She politely declines any genetic consult due to family history of breast cancer.  Referral placed for colonoscopy, dermatology.      Relevant Orders   Ambulatory referral to Gastroenterology   CBC Humphrey Differential/Platelet   Comprehensive metabolic panel   Hemoglobin A1c   Lipid panel   TSH   VITAMIN D 25 Hydroxy (Vit-D Deficiency, Fractures)   Ambulatory referral to Dermatology       I have discontinued Kristine Royal. Ziemba's pravastatin and felodipine. I am also having her start on felodipine. Additionally, I am having her maintain her ALPRAZolam, lisinopril-hydrochlorothiazide, and sertraline.   Meds ordered this encounter  Medications  . felodipine (PLENDIL) 10 MG 24 hr tablet      Sig: Take 1 tablet (10 mg total) by mouth daily.    Dispense:  90 tablet    Refill:  1    Order Specific Question:   Supervising Provider    Answer:   Crecencio Mc [[id number]]    Return precautions given.   Risks, benefits, and alternatives of the medications and treatment plan prescribed today were discussed, and patient expressed understanding.   Education regarding symptom management and diagnosis given to patient on AVS.   Continue to follow Humphrey Burnard Hawthorne, FNP for routine health maintenance.   Danielle Humphrey and I agreed Humphrey plan.   Mable Paris, FNP

## 2018-06-28 NOTE — Assessment & Plan Note (Addendum)
Trial of zoloft. Long discussion regarding alcohol use. Advised no more than one glass of wine per night. We will also titrate off the xanax as SSRI far safer, more effective.Referral to counseling. Close f/u as patient returns next week. She will also bring FMLA paperwork in as I think a leave of absence appropriate. She verbalized understanding of all.

## 2018-06-28 NOTE — Telephone Encounter (Signed)
Pt dropped off FMLA paperwork to be filled out. Placed in Arnett's colored folder upfront. Please contact pt when completed

## 2018-06-28 NOTE — Assessment & Plan Note (Signed)
New ( to me) . Declines labs today as we will do labs to CPE next week.

## 2018-07-01 DIAGNOSIS — Z0279 Encounter for issue of other medical certificate: Secondary | ICD-10-CM

## 2018-07-01 NOTE — Telephone Encounter (Signed)
Placed in folder

## 2018-07-03 NOTE — Telephone Encounter (Signed)
completed

## 2018-07-05 ENCOUNTER — Ambulatory Visit (INDEPENDENT_AMBULATORY_CARE_PROVIDER_SITE_OTHER): Payer: No Typology Code available for payment source | Admitting: Family

## 2018-07-05 ENCOUNTER — Encounter: Payer: Self-pay | Admitting: Family

## 2018-07-05 VITALS — BP 138/84 | HR 64 | Temp 98.5°F | Resp 14 | Ht 69.0 in | Wt 197.5 lb

## 2018-07-05 DIAGNOSIS — Z0000 Encounter for general adult medical examination without abnormal findings: Secondary | ICD-10-CM

## 2018-07-05 DIAGNOSIS — I340 Nonrheumatic mitral (valve) insufficiency: Secondary | ICD-10-CM | POA: Insufficient documentation

## 2018-07-05 DIAGNOSIS — I10 Essential (primary) hypertension: Secondary | ICD-10-CM

## 2018-07-05 DIAGNOSIS — F411 Generalized anxiety disorder: Secondary | ICD-10-CM

## 2018-07-05 DIAGNOSIS — Z23 Encounter for immunization: Secondary | ICD-10-CM

## 2018-07-05 DIAGNOSIS — R232 Flushing: Secondary | ICD-10-CM

## 2018-07-05 LAB — COMPREHENSIVE METABOLIC PANEL
ALK PHOS: 30 U/L — AB (ref 39–117)
ALT: 13 U/L (ref 0–35)
AST: 15 U/L (ref 0–37)
Albumin: 4.6 g/dL (ref 3.5–5.2)
BILIRUBIN TOTAL: 0.6 mg/dL (ref 0.2–1.2)
BUN: 21 mg/dL (ref 6–23)
CALCIUM: 10.3 mg/dL (ref 8.4–10.5)
CO2: 26 meq/L (ref 19–32)
CREATININE: 0.91 mg/dL (ref 0.40–1.20)
Chloride: 103 mEq/L (ref 96–112)
GFR: 67.23 mL/min (ref >60.00)
GLUCOSE: 97 mg/dL (ref 70–99)
Potassium: 3.3 mEq/L — ABNORMAL LOW (ref 3.5–5.1)
Sodium: 138 mEq/L (ref 135–145)
TOTAL PROTEIN: 7.8 g/dL (ref 6.0–8.3)

## 2018-07-05 LAB — LIPID PANEL
Cholesterol: 239 mg/dL — ABNORMAL HIGH (ref 0–200)
HDL: 43.1 mg/dL (ref >39.00)
LDL CALC: 158 mg/dL — AB (ref 0–99)
NonHDL: 195.43
TRIGLYCERIDES: 185 mg/dL — AB (ref 0.0–149.0)
Total CHOL/HDL Ratio: 6
VLDL: 37 mg/dL (ref 0.0–40.0)

## 2018-07-05 LAB — CBC WITH DIFFERENTIAL/PLATELET
BASOS ABS: 0.1 10*3/uL (ref 0.0–0.1)
Basophils Relative: 0.8 % (ref 0.0–3.0)
EOS ABS: 0.1 10*3/uL (ref 0.0–0.7)
Eosinophils Relative: 2.1 % (ref 0.0–5.0)
HEMATOCRIT: 42.8 % (ref 36.0–46.0)
Hemoglobin: 14.3 g/dL (ref 12.0–15.0)
LYMPHS PCT: 29.1 % (ref 12.0–46.0)
Lymphs Abs: 2.1 10*3/uL (ref 0.7–4.0)
MCHC: 33.3 g/dL (ref 30.0–36.0)
MCV: 90.7 fl (ref 78.0–100.0)
MONOS PCT: 8.4 % (ref 3.0–12.0)
Monocytes Absolute: 0.6 10*3/uL (ref 0.1–1.0)
NEUTROS ABS: 4.3 10*3/uL (ref 1.4–7.7)
NEUTROS PCT: 59.6 % (ref 43.0–77.0)
PLATELETS: 242 10*3/uL (ref 150.0–400.0)
RBC: 4.72 Mil/uL (ref 3.87–5.11)
RDW: 13 % (ref 11.5–15.5)
WBC: 7.2 10*3/uL (ref 4.0–10.5)

## 2018-07-05 LAB — VITAMIN D 25 HYDROXY (VIT D DEFICIENCY, FRACTURES): VITD: 27.83 ng/mL — ABNORMAL LOW (ref 30.00–100.00)

## 2018-07-05 LAB — HEMOGLOBIN A1C: Hgb A1c MFr Bld: 5.6 % (ref 4.6–6.5)

## 2018-07-05 LAB — TSH: TSH: 3.7 u[IU]/mL (ref 0.35–4.50)

## 2018-07-05 MED ORDER — FELODIPINE ER 10 MG PO TB24: 10.0000 mg | ORAL_TABLET | Freq: Every day | ORAL | 1 refills | Status: DC

## 2018-07-05 NOTE — Assessment & Plan Note (Signed)
Improved on zoloft. Will follow

## 2018-07-05 NOTE — Assessment & Plan Note (Addendum)
Asymptomatic. Education provided on letting me know of any symptoms. Per recommendations, we will repeat echo 2020 ( last done in 2015) since appearing mild and she is asymptomatic.

## 2018-07-05 NOTE — Patient Instructions (Addendum)
Increase felodipine to '10mg'$   Monitor blood pressure,  Goal is less than 130/80; if persistently higher, please make sooner follow up appointment so we can recheck you blood pressure and manage medications  Please let me know if you ever experience dizziness, difficulty with exercise, palpitations - this would may mean we need to further evaluate your mitral valve regurgitation.   Glad you are doing better  Today we discussed referrals, orders. Dermatology, gastroenterology ( colonoscopy),  counseling   I have placed these orders in the system for you.  Please be sure to give [place] a call if you have not heard from our office regarding this. We should hear from [place] within ONE week with information regarding your appointment. If not, please let me know immediately.       Health Maintenance for Postmenopausal Women Menopause is a normal process in which your reproductive ability comes to an end. This process happens gradually over a span of months to years, usually between the ages of 63 and 76. Menopause is complete when you have missed 12 consecutive menstrual periods. It is important to talk with your health care provider about some of the most common conditions that affect postmenopausal women, such as heart disease, cancer, and bone loss (osteoporosis). Adopting a healthy lifestyle and getting preventive care can help to promote your health and wellness. Those actions can also lower your chances of developing some of these common conditions. What should I know about menopause? During menopause, you may experience a number of symptoms, such as:  Moderate-to-severe hot flashes.  Night sweats.  Decrease in sex drive.  Mood swings.  Headaches.  Tiredness.  Irritability.  Memory problems.  Insomnia.  Choosing to treat or not to treat menopausal changes is an individual decision that you make with your health care provider. What should I know about hormone replacement therapy and  supplements? Hormone therapy products are effective for treating symptoms that are associated with menopause, such as hot flashes and night sweats. Hormone replacement carries certain risks, especially as you become older. If you are thinking about using estrogen or estrogen with progestin treatments, discuss the benefits and risks with your health care provider. What should I know about heart disease and stroke? Heart disease, heart attack, and stroke become more likely as you age. This may be due, in part, to the hormonal changes that your body experiences during menopause. These can affect how your body processes dietary fats, triglycerides, and cholesterol. Heart attack and stroke are both medical emergencies. There are many things that you can do to help prevent heart disease and stroke:  Have your blood pressure checked at least every 1-2 years. High blood pressure causes heart disease and increases the risk of stroke.  If you are 40-56 years old, ask your health care provider if you should take aspirin to prevent a heart attack or a stroke.  Do not use any tobacco products, including cigarettes, chewing tobacco, or electronic cigarettes. If you need help quitting, ask your health care provider.  It is important to eat a healthy diet and maintain a healthy weight. ? Be sure to include plenty of vegetables, fruits, low-fat dairy products, and lean protein. ? Avoid eating foods that are high in solid fats, added sugars, or salt (sodium).  Get regular exercise. This is one of the most important things that you can do for your health. ? Try to exercise for at least 150 minutes each week. The type of exercise that you do should  heart rate and make you sweat. This is known as moderate-intensity exercise. ? Try to do strengthening exercises at least twice each week. Do these in addition to the moderate-intensity exercise.  Know your numbers.Ask your health care provider to check  your cholesterol and your blood glucose. Continue to have your blood tested as directed by your health care provider.  What should I know about cancer screening? There are several types of cancer. Take the following steps to reduce your risk and to catch any cancer development as early as possible. Breast Cancer  Practice breast self-awareness. ? This means understanding how your breasts normally appear and feel. ? It also means doing regular breast self-exams. Let your health care provider know about any changes, no matter how small.  If you are 40 or older, have a clinician do a breast exam (clinical breast exam or CBE) every year. Depending on your age, family history, and medical history, it may be recommended that you also have a yearly breast X-ray (mammogram).  If you have a family history of breast cancer, talk with your health care provider about genetic screening.  If you are at high risk for breast cancer, talk with your health care provider about having an MRI and a mammogram every year.  Breast cancer (BRCA) gene test is recommended for women who have family members with BRCA-related cancers. Results of the assessment will determine the need for genetic counseling and BRCA1 and for BRCA2 testing. BRCA-related cancers include these types: ? Breast. This occurs in males or females. ? Ovarian. ? Tubal. This may also be called fallopian tube cancer. ? Cancer of the abdominal or pelvic lining (peritoneal cancer). ? Prostate. ? Pancreatic.  Cervical, Uterine, and Ovarian Cancer Your health care provider may recommend that you be screened regularly for cancer of the pelvic organs. These include your ovaries, uterus, and vagina. This screening involves a pelvic exam, which includes checking for microscopic changes to the surface of your cervix (Pap test).  For women ages 21-65, health care providers may recommend a pelvic exam and a Pap test every three years. For women ages 30-65,  they may recommend the Pap test and pelvic exam, combined with testing for human papilloma virus (HPV), every five years. Some types of HPV increase your risk of cervical cancer. Testing for HPV may also be done on women of any age who have unclear Pap test results.  Other health care providers may not recommend any screening for nonpregnant women who are considered low risk for pelvic cancer and have no symptoms. Ask your health care provider if a screening pelvic exam is right for you.  If you have had past treatment for cervical cancer or a condition that could lead to cancer, you need Pap tests and screening for cancer for at least 20 years after your treatment. If Pap tests have been discontinued for you, your risk factors (such as having a new sexual partner) need to be reassessed to determine if you should start having screenings again. Some women have medical problems that increase the chance of getting cervical cancer. In these cases, your health care provider may recommend that you have screening and Pap tests more often.  If you have a family history of uterine cancer or ovarian cancer, talk with your health care provider about genetic screening.  If you have vaginal bleeding after reaching menopause, tell your health care provider.  There are currently no reliable tests available to screen for ovarian cancer.  Lung   Lung Cancer Lung cancer screening is recommended for adults 14-22 years old who are at high risk for lung cancer because of a history of smoking. A yearly low-dose CT scan of the lungs is recommended if you:  Currently smoke.  Have a history of at least 30 pack-years of smoking and you currently smoke or have quit within the past 15 years. A pack-year is smoking an average of one pack of cigarettes per day for one year.  Yearly screening should:  Continue until it has been 15 years since you quit.  Stop if you develop a health problem that would prevent you from having lung  cancer treatment.  Colorectal Cancer  This type of cancer can be detected and can often be prevented.  Routine colorectal cancer screening usually begins at age 76 and continues through age 78.  If you have risk factors for colon cancer, your health care provider may recommend that you be screened at an earlier age.  If you have a family history of colorectal cancer, talk with your health care provider about genetic screening.  Your health care provider may also recommend using home test kits to check for hidden blood in your stool.  A small camera at the end of a tube can be used to examine your colon directly (sigmoidoscopy or colonoscopy). This is done to check for the earliest forms of colorectal cancer.  Direct examination of the colon should be repeated every 5-10 years until age 63. However, if early forms of precancerous polyps or small growths are found or if you have a family history or genetic risk for colorectal cancer, you may need to be screened more often.  Skin Cancer  Check your skin from head to toe regularly.  Monitor any moles. Be sure to tell your health care provider: ? About any new moles or changes in moles, especially if there is a change in a mole's shape or color. ? If you have a mole that is larger than the size of a pencil eraser.  If any of your family members has a history of skin cancer, especially at a young age, talk with your health care provider about genetic screening.  Always use sunscreen. Apply sunscreen liberally and repeatedly throughout the day.  Whenever you are outside, protect yourself by wearing long sleeves, pants, a wide-brimmed hat, and sunglasses.  What should I know about osteoporosis? Osteoporosis is a condition in which bone destruction happens more quickly than new bone creation. After menopause, you may be at an increased risk for osteoporosis. To help prevent osteoporosis or the bone fractures that can happen because of  osteoporosis, the following is recommended:  If you are 64-4 years old, get at least 1,000 mg of calcium and at least 600 mg of vitamin D per day.  If you are older than age 56 but younger than age 26, get at least 1,200 mg of calcium and at least 600 mg of vitamin D per day.  If you are older than age 1, get at least 1,200 mg of calcium and at least 800 mg of vitamin D per day.  Smoking and excessive alcohol intake increase the risk of osteoporosis. Eat foods that are rich in calcium and vitamin D, and do weight-bearing exercises several times each week as directed by your health care provider. What should I know about how menopause affects my mental health? Depression may occur at any age, but it is more common as you become older. Common symptoms of  include:  Low or sad mood.  Changes in sleep patterns.  Changes in appetite or eating patterns.  Feeling an overall lack of motivation or enjoyment of activities that you previously enjoyed.  Frequent crying spells.  Talk with your health care provider if you think that you are experiencing depression. What should I know about immunizations? It is important that you get and maintain your immunizations. These include:  Tetanus, diphtheria, and pertussis (Tdap) booster vaccine.  Influenza every year before the flu season begins.  Pneumonia vaccine.  Shingles vaccine.  Your health care provider may also recommend other immunizations. This information is not intended to replace advice given to you by your health care provider. Make sure you discuss any questions you have with your health care provider. Document Released: 11/03/2005 Document Revised: 03/31/2016 Document Reviewed: 06/15/2015 Elsevier Interactive Patient Education  2018 [employer]  

## 2018-07-05 NOTE — Telephone Encounter (Signed)
Patient advised [employer] paperwork completed given to her at appointment

## 2018-07-05 NOTE — Assessment & Plan Note (Signed)
Pleased to see how well  Patient is doing today.  She states that she is feeling much better on the Zoloft, and her appearance today is indicative of that.  We will continue Zoloft this time.  Referral to counseling placed as well.  Patient is currently on leave of absence for work.  She will notify me if she does not continue to feel better and also when she is ready to return to work.

## 2018-07-05 NOTE — Assessment & Plan Note (Signed)
Clinical breast exam performed today.  Deferred pelvic exam as Pap smear is up-to-date and she has no complaints today.  She politely declines any genetic consult due to family history of breast cancer.  Referral placed for colonoscopy, dermatology.

## 2018-07-05 NOTE — Assessment & Plan Note (Signed)
Uncontrolled. Increased CCB. Patient will monitor BP at home and let me know if not at goal.

## 2018-07-06 ENCOUNTER — Other Ambulatory Visit: Payer: Self-pay | Admitting: Family

## 2018-07-06 DIAGNOSIS — I10 Essential (primary) hypertension: Secondary | ICD-10-CM

## 2018-07-08 NOTE — Progress Notes (Signed)
Patient advised of your recommendations in regards to follow up for echo she verbalized understanding

## 2018-07-12 NOTE — Telephone Encounter (Signed)
Called patient to advise note ready for pick up

## 2018-07-12 NOTE — Telephone Encounter (Signed)
Call pt  Work note to return printed  Advise she may come get

## 2018-07-17 ENCOUNTER — Encounter: Payer: Self-pay | Admitting: *Deleted

## 2018-07-18 ENCOUNTER — Other Ambulatory Visit (INDEPENDENT_AMBULATORY_CARE_PROVIDER_SITE_OTHER): Payer: No Typology Code available for payment source

## 2018-07-18 DIAGNOSIS — I10 Essential (primary) hypertension: Secondary | ICD-10-CM

## 2018-07-18 LAB — BASIC METABOLIC PANEL
BUN: 21 mg/dL (ref 6–23)
CALCIUM: 10 mg/dL (ref 8.4–10.5)
CO2: 26 meq/L (ref 19–32)
CREATININE: 0.99 mg/dL (ref 0.40–1.20)
Chloride: 102 mEq/L (ref 96–112)
GFR: 60.99 mL/min (ref >60.00)
Glucose, Bld: 102 mg/dL — ABNORMAL HIGH (ref 70–99)
Potassium: 3.9 mEq/L (ref 3.5–5.1)
SODIUM: 137 meq/L (ref 135–145)

## 2018-07-20 IMAGING — DX DG FOOT COMPLETE 3+V*L*
3 series · 3 of 3 positions shown · non-contrast
Comparison: None.

CLINICAL DATA: Left foot swelling for 3 weeks.

EXAM:
LEFT FOOT - COMPLETE 3+ VIEW

[foot ap]
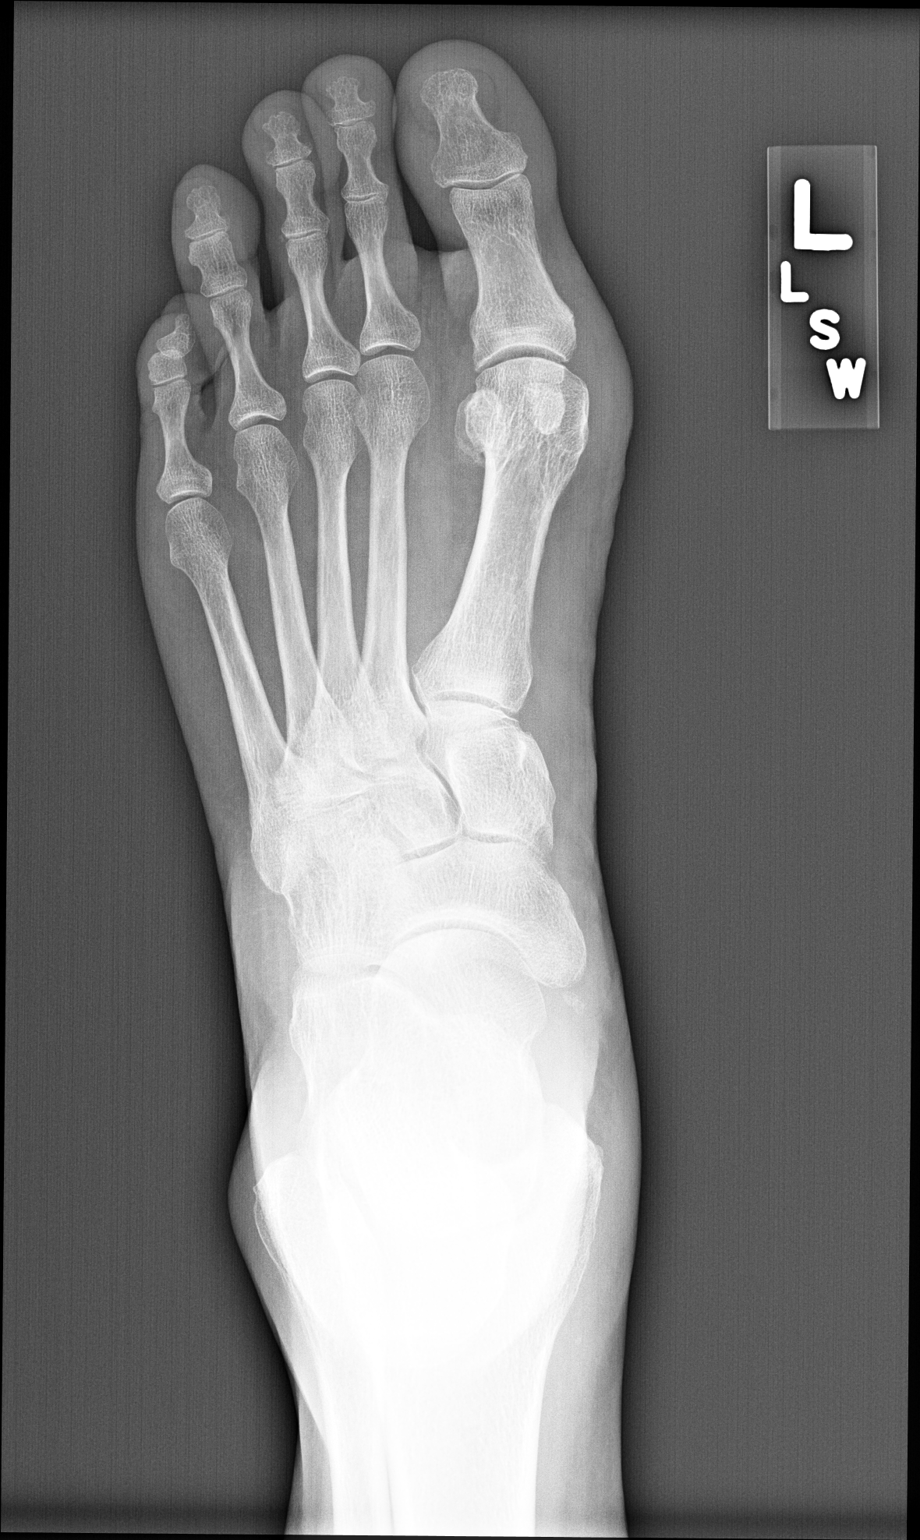

[foot obl (oblique)]
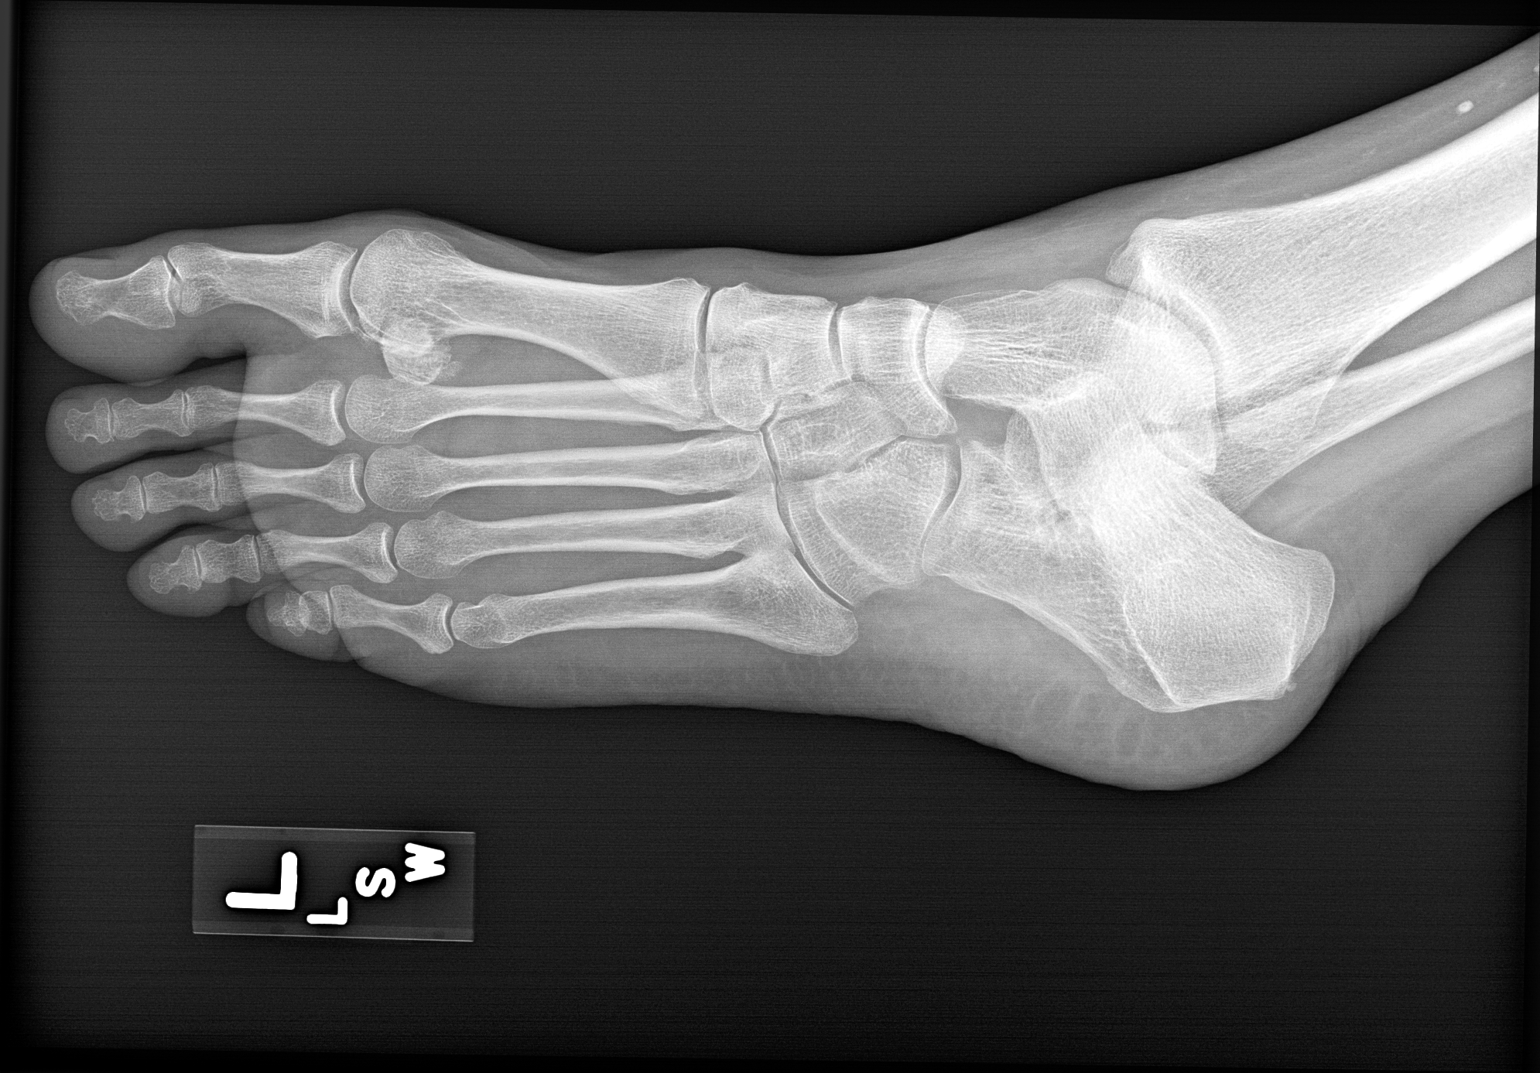

[foot lat]
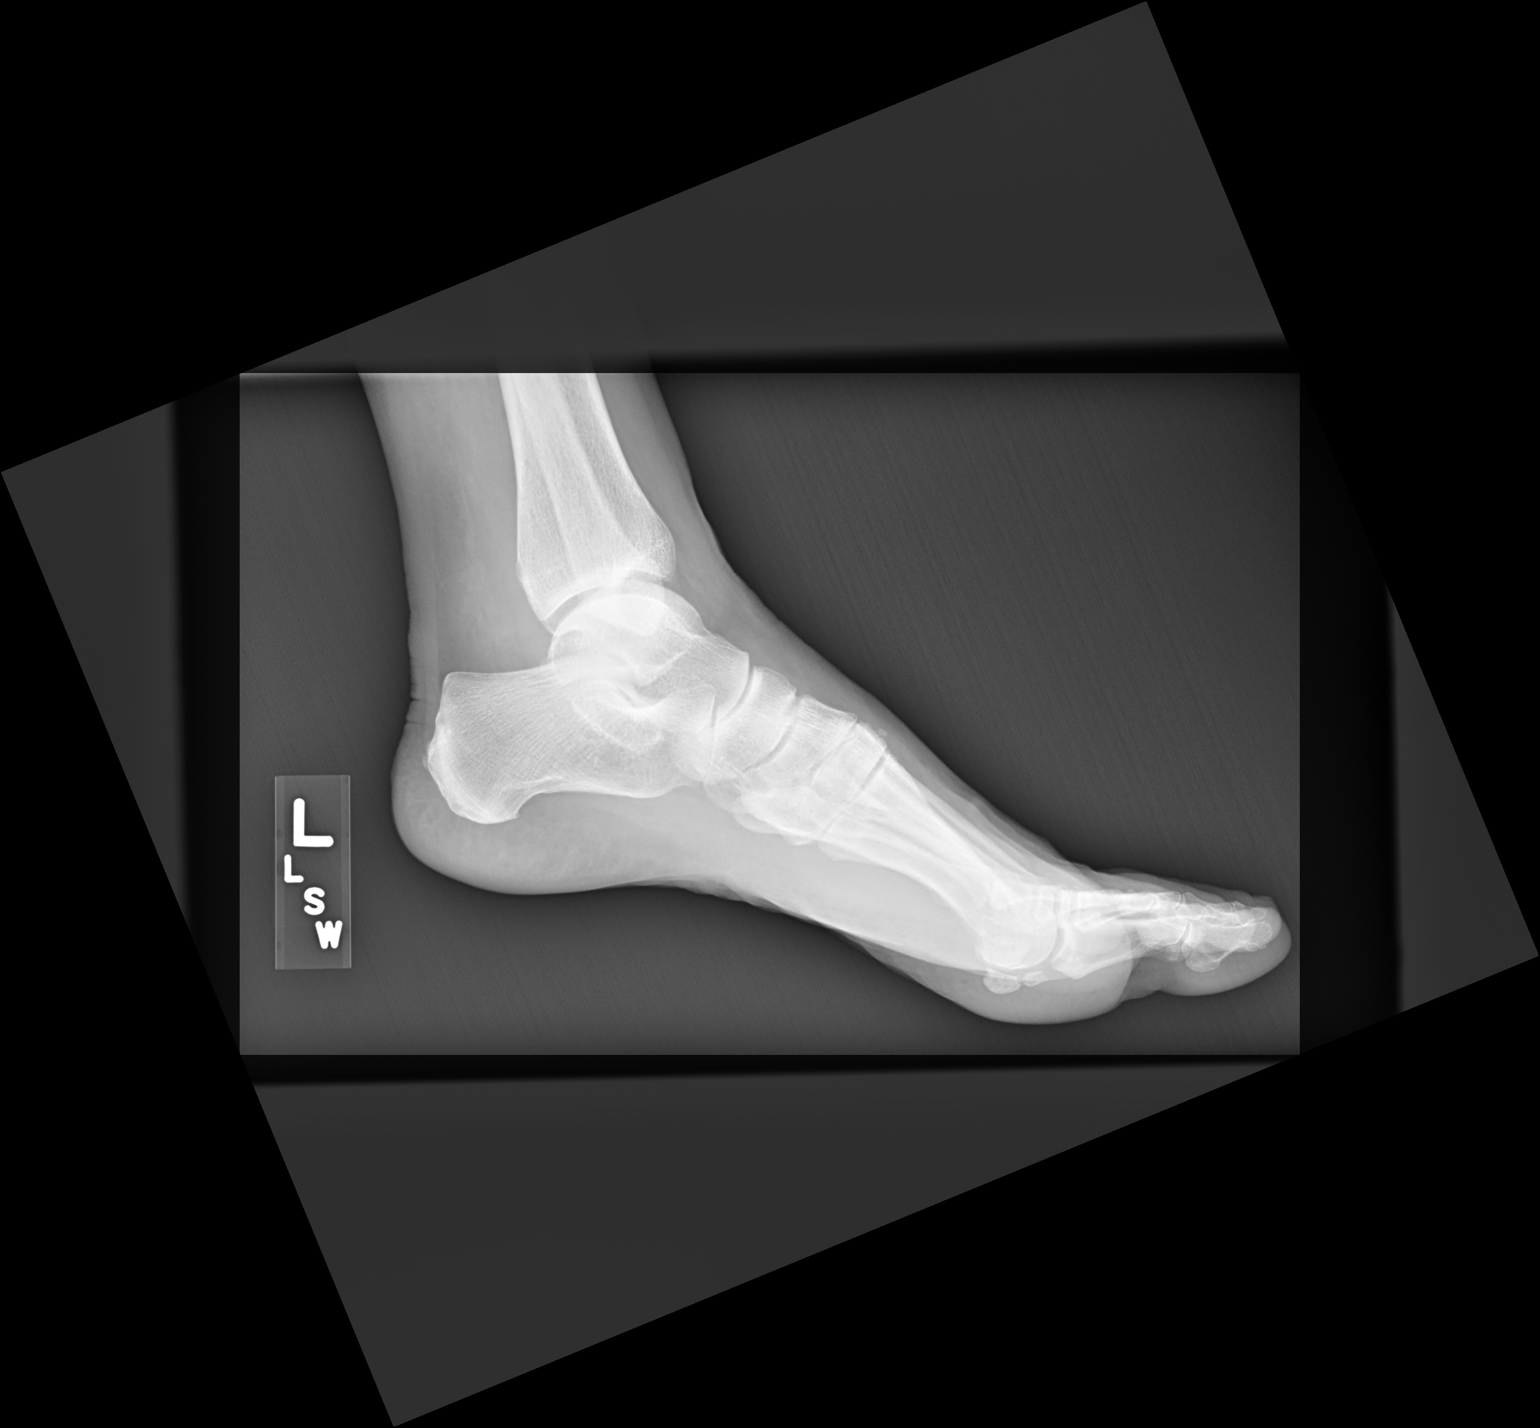

[3 of 3 positions shown; findings below may reference images not displayed]

FINDINGS: There is no evidence of fracture or dislocation. There is no
evidence of arthropathy or other focal bone abnormality. Soft
tissues are unremarkable.
IMPRESSION: Negative.

## 2018-07-23 ENCOUNTER — Ambulatory Visit: Payer: No Typology Code available for payment source | Admitting: Psychology

## 2018-07-23 ENCOUNTER — Other Ambulatory Visit: Payer: Self-pay | Admitting: Family

## 2018-07-23 DIAGNOSIS — F4323 Adjustment disorder with mixed anxiety and depressed mood: Secondary | ICD-10-CM

## 2018-07-23 DIAGNOSIS — F411 Generalized anxiety disorder: Secondary | ICD-10-CM

## 2018-07-24 NOTE — Telephone Encounter (Signed)
Last filled 05/23/18 Last office visit 07/05/18 Next office visit 08/16/18

## 2018-07-24 NOTE — Telephone Encounter (Signed)
I looked up patient on [place] Controlled Substances Reporting System and saw no activity that raised concern of inappropriate use.   

## 2018-07-30 ENCOUNTER — Ambulatory Visit: Payer: No Typology Code available for payment source | Admitting: Psychology

## 2018-07-30 DIAGNOSIS — F4323 Adjustment disorder with mixed anxiety and depressed mood: Secondary | ICD-10-CM

## 2018-08-06 ENCOUNTER — Other Ambulatory Visit: Payer: Self-pay

## 2018-08-06 ENCOUNTER — Other Ambulatory Visit: Payer: Self-pay | Admitting: Family

## 2018-08-06 MED ORDER — LISINOPRIL-HYDROCHLOROTHIAZIDE 20-25 MG PO TABS: 1.0000 | ORAL_TABLET | Freq: Every day | ORAL | 0 refills | Status: DC

## 2018-08-13 ENCOUNTER — Ambulatory Visit: Payer: No Typology Code available for payment source | Admitting: Psychology

## 2018-08-13 DIAGNOSIS — F4323 Adjustment disorder with mixed anxiety and depressed mood: Secondary | ICD-10-CM

## 2018-08-16 ENCOUNTER — Encounter: Payer: Self-pay | Admitting: Family

## 2018-08-16 ENCOUNTER — Ambulatory Visit: Payer: No Typology Code available for payment source | Admitting: Family

## 2018-08-16 DIAGNOSIS — I10 Essential (primary) hypertension: Secondary | ICD-10-CM | POA: Diagnosis not present

## 2018-08-16 DIAGNOSIS — F322 Major depressive disorder, single episode, severe without psychotic features: Secondary | ICD-10-CM | POA: Diagnosis not present

## 2018-08-16 DIAGNOSIS — F411 Generalized anxiety disorder: Secondary | ICD-10-CM | POA: Diagnosis not present

## 2018-08-16 MED ORDER — SERTRALINE HCL 100 MG PO TABS: 100.0000 mg | ORAL_TABLET | Freq: Every day | ORAL | 3 refills | Status: DC

## 2018-08-16 NOTE — Assessment & Plan Note (Signed)
Improved. Continue regimen 

## 2018-08-16 NOTE — Patient Instructions (Signed)
Increase zoloft to 100mg  at night.   Pleasure seeing you

## 2018-08-16 NOTE — Assessment & Plan Note (Signed)
Increase zoloft. Pleased with overall improvement. Patient will let me know.

## 2018-08-16 NOTE — Progress Notes (Signed)
Subjective:    Patient ID: Danielle Humphrey, female    DOB: 1960/11/13, 57 y.o.   MRN: [id number]  CC: Danielle Humphrey is a 57 y.o. female who presents today for follow up.   HPI: GAD- Overall improved. interested in increase zoloft; xanax QOD. Trouble falling and staying asleep. Some fatigue. No si/hi. No panic attacks.   HTN- compliant with medications. Denies exertional chest pain or pressure, numbness or tingling radiating to left arm or jaw, palpitations, dizziness, frequent headaches, changes in vision, or shortness of breath.   H/o shingles. Would like shingrex    HISTORY:  Past Medical History:  Diagnosis Date  . Chickenpox 1960  . Hypercholesteremia 2017  . Hypertension 1980  . Mitral valve prolapse 2016  . Shingles 2016   Past Surgical History:  Procedure Laterality Date  . 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 APPENDECTOMY Bilateral 1968  . BREAST EXCISIONAL BIOPSY Bilateral 2005   benign  . BREAST LUMPECTOMY Bilateral 2005   Family History  Problem Relation Age of Onset  . Breast cancer Mother 73       2005  . Dementia Mother   . Stroke Mother   . Lupus Mother   . Hypercholesterolemia Father   . Heart attack Father        4  . Hypertension Father   . Diabetes type II Sister   . Heart disease Brother   . Ovarian cancer Neg Hx     Allergies: Codeine and Methotrexate derivatives Current Outpatient Medications on File Prior to Visit  Medication Sig Dispense Refill  . ALPRAZolam (XANAX) 0.5 MG tablet TAKE ONE TABLET BY MOUTH ONCE DAILY AS NEEDED FOR ANXIETY 30 tablet 1  . felodipine (PLENDIL) 10 MG 24 hr tablet Take 1 tablet (10 mg total) by mouth daily. 90 tablet 1  . lisinopril-hydrochlorothiazide (PRINZIDE,ZESTORETIC) 20-25 MG tablet Take 1 tablet by mouth daily. 90 tablet 0   No current facility-administered medications on file prior to visit.     Social History   Tobacco Use  . Smoking status: Never Smoker  . Smokeless tobacco: Never  Used  Substance Use Topics  . Alcohol use: Yes    Alcohol/week: 7.0 standard drinks    Types: 3 Glasses of wine, 4 Cans of beer per week  . Drug use: No    Review of Systems  Constitutional: Negative for chills and fever.  Respiratory: Negative for cough.   Cardiovascular: Negative for chest pain and palpitations.  Gastrointestinal: Negative for nausea and vomiting.  Psychiatric/Behavioral: Positive for sleep disturbance.      Objective:    BP 118/74 (BP Location: Left Arm, Patient Position: Sitting, Cuff Size: Large)   Pulse 90   Temp (!) 97 F (36.1 C)   Ht 5\' 9"  (1.753 m)   Wt 196 lb 4 oz (89 kg)   SpO2 96%   BMI 28.98 kg/m  BP Readings from Last 3 Encounters:  08/16/18 118/74  07/05/18 138/84  06/28/18 130/90   Wt Readings from Last 3 Encounters:  08/16/18 196 lb 4 oz (89 kg)  07/05/18 197 lb 8 oz (89.6 kg)  06/28/18 197 lb 6 oz (89.5 kg)    Physical Exam  Constitutional: She appears well-developed and well-nourished.  Eyes: Conjunctivae are normal.  Cardiovascular: Normal rate, regular rhythm, normal heart sounds and normal pulses.  Pulmonary/Chest: Effort normal and breath sounds normal. She has no wheezes. She has no rhonchi. She has no rales.  Neurological: She is alert.  Skin: Skin is warm and dry.  Psychiatric: She has a normal mood and affect. Her speech is normal and behavior is normal. Thought content normal.  Vitals reviewed.      Assessment & Plan:   Problem List Items Addressed This Visit      Cardiovascular and Mediastinum   HTN (hypertension)    Improved. Continue regimen.         Other   Generalized anxiety disorder    Increase zoloft. Pleased with overall improvement. Patient will let me know.       Relevant Medications   sertraline (ZOLOFT) 100 MG tablet   Depression, major, single episode, severe (HCC)   Relevant Medications   sertraline (ZOLOFT) 100 MG tablet       I have changed Kristine Royal. Kope's sertraline. I am also  having her maintain her felodipine, ALPRAZolam, and lisinopril-hydrochlorothiazide.   Meds ordered this encounter  Medications  . sertraline (ZOLOFT) 100 MG tablet    Sig: Take 1 tablet (100 mg total) by mouth at bedtime.    Dispense:  90 tablet    Refill:  3    Order Specific Question:   Supervising Provider    Answer:   Crecencio Mc [[id number]]    Return precautions given.   Risks, benefits, and alternatives of the medications and treatment plan prescribed today were discussed, and patient expressed understanding.   Education regarding symptom management and diagnosis given to patient on AVS.  Continue to follow with Burnard Hawthorne, FNP for routine health maintenance.   Danielle Humphrey and I agreed with plan.   Mable Paris, FNP

## 2018-09-03 ENCOUNTER — Ambulatory Visit: Payer: No Typology Code available for payment source | Admitting: Psychology

## 2018-09-03 DIAGNOSIS — F4323 Adjustment disorder with mixed anxiety and depressed mood: Secondary | ICD-10-CM | POA: Diagnosis not present

## 2018-10-08 ENCOUNTER — Ambulatory Visit: Payer: No Typology Code available for payment source | Admitting: Psychology

## 2018-10-08 DIAGNOSIS — F4323 Adjustment disorder with mixed anxiety and depressed mood: Secondary | ICD-10-CM | POA: Diagnosis not present

## 2018-10-11 ENCOUNTER — Encounter: Payer: Self-pay | Admitting: Family

## 2018-10-11 ENCOUNTER — Ambulatory Visit (INDEPENDENT_AMBULATORY_CARE_PROVIDER_SITE_OTHER): Payer: No Typology Code available for payment source

## 2018-10-11 ENCOUNTER — Ambulatory Visit: Payer: No Typology Code available for payment source | Admitting: Family

## 2018-10-11 VITALS — BP 135/85 | HR 86 | Temp 98.1°F | Wt 199.2 lb

## 2018-10-11 DIAGNOSIS — F322 Major depressive disorder, single episode, severe without psychotic features: Secondary | ICD-10-CM | POA: Diagnosis not present

## 2018-10-11 DIAGNOSIS — R05 Cough: Secondary | ICD-10-CM | POA: Insufficient documentation

## 2018-10-11 DIAGNOSIS — I10 Essential (primary) hypertension: Secondary | ICD-10-CM

## 2018-10-11 DIAGNOSIS — R059 Cough, unspecified: Secondary | ICD-10-CM

## 2018-10-11 MED ORDER — OMEPRAZOLE 20 MG PO CPDR: 20.0000 mg | DELAYED_RELEASE_CAPSULE | Freq: Every day | ORAL | 1 refills | Status: DC

## 2018-10-11 NOTE — Assessment & Plan Note (Signed)
Doing well on Zoloft, will continue

## 2018-10-11 NOTE — Patient Instructions (Addendum)
Start mucinex PLAIN.   At this point I suspect acid reflux may be causing her cough.  Please start Prilosec as we discussed today.  Please see me a note if this is ineffective, as likely we have to do a trial stop of lisinopril.

## 2018-10-11 NOTE — Progress Notes (Signed)
Subjective:    Patient ID: Danielle Humphrey, female    DOB: 02-Apr-1961, 58 y.o.   MRN: [id number]  CC: Danielle Humphrey is a 58 y.o. female who presents today for an acute visit.    HPI: Chief complaint dry cough x 6 weeks, unchanged.  Sporadic, notices if talks a lot will cough. At night, when laying down will have coughing spell , 30 minutes.   Started mucinex- D without temporarily relief. Does help at nighttime.  Had been wheezing, improved on mucinex. No cp, sob, leg swelling, sinus pressure, fever.   Former smoker 58 years old ; no h/o asthma.   Takes tums every night. Had epigastric burning particularly at bedtime.   Drinking  Less alcohol; 2 glasses of wine on occasional during the week.    Depression-At last visit increase Zoloft. Feels better. Seeing Alene Mires. Using xanax very rarely . Sleeping well.    HTN- compliant with medications. Doesn't check it at home.    HISTORY:  Past Medical History:  Diagnosis Date  . Chickenpox 1960  . Hypercholesteremia 2017  . Hypertension 1980  . Mitral valve prolapse 2016  . Shingles 2016   Past Surgical History:  Procedure Laterality Date  . 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 APPENDECTOMY Bilateral 1968  . BREAST EXCISIONAL BIOPSY Bilateral 2005   benign  . BREAST LUMPECTOMY Bilateral 2005   Family History  Problem Relation Age of Onset  . Breast cancer Mother 57       2005  . Dementia Mother   . Stroke Mother   . Lupus Mother   . Hypercholesterolemia Father   . Heart attack Father        72  . Hypertension Father   . Diabetes type II Sister   . Heart disease Brother   . Ovarian cancer Neg Hx     Allergies: Codeine and Methotrexate derivatives Current Outpatient Medications on File Prior to Visit  Medication Sig Dispense Refill  . ALPRAZolam (XANAX) 0.5 MG tablet TAKE ONE TABLET BY MOUTH ONCE DAILY AS NEEDED FOR ANXIETY 30 tablet 1  . felodipine (PLENDIL) 10 MG 24 hr tablet Take 1 tablet (10 mg total) by  mouth daily. 90 tablet 1  . lisinopril-hydrochlorothiazide (PRINZIDE,ZESTORETIC) 20-25 MG tablet Take 1 tablet by mouth daily. 90 tablet 0  . sertraline (ZOLOFT) 100 MG tablet Take 1 tablet (100 mg total) by mouth at bedtime. 90 tablet 3   No current facility-administered medications on file prior to visit.     Social History   Tobacco Use  . Smoking status: Never Smoker  . Smokeless tobacco: Never Used  Substance Use Topics  . Alcohol use: Yes    Alcohol/week: 7.0 standard drinks    Types: 3 Glasses of wine, 4 Cans of beer per week  . Drug use: No    Review of Systems  Constitutional: Negative for chills and fever.  HENT: Negative for congestion, postnasal drip, sinus pressure, sinus pain and sore throat.   Respiratory: Positive for cough. Negative for shortness of breath and wheezing.   Cardiovascular: Negative for chest pain and palpitations.  Gastrointestinal: Negative for nausea and vomiting.      Objective:    BP 135/85   Pulse 86   Temp 98.1 F (36.7 C)   Wt 199 lb 3.2 oz (90.4 kg)   SpO2 98%   BMI 29.42 kg/m  BP Readings from Last 3 Encounters:  10/11/18 135/85  08/16/18 118/74  07/05/18 138/84     Physical Exam Vitals signs reviewed.  Constitutional:      Appearance: She is well-developed.  HENT:     Head: Normocephalic and atraumatic.     Right Ear: Hearing, tympanic membrane, ear canal and external ear normal. No decreased hearing noted. No drainage, swelling or tenderness. No middle ear effusion. No foreign body. Tympanic membrane is not erythematous or bulging.     Left Ear: Hearing, tympanic membrane, ear canal and external ear normal. No decreased hearing noted. No drainage, swelling or tenderness.  No middle ear effusion. No foreign body. Tympanic membrane is not erythematous or bulging.     Nose: Nose normal. No rhinorrhea.     Right Sinus: No maxillary sinus tenderness or frontal sinus tenderness.     Left Sinus: No maxillary sinus tenderness or  frontal sinus tenderness.     Mouth/Throat:     Pharynx: Uvula midline. No oropharyngeal exudate or posterior oropharyngeal erythema.     Tonsils: No tonsillar abscesses.  Eyes:     Conjunctiva/sclera: Conjunctivae normal.  Cardiovascular:     Rate and Rhythm: Regular rhythm.     Pulses: Normal pulses.     Heart sounds: Normal heart sounds.  Pulmonary:     Effort: Pulmonary effort is normal.     Breath sounds: Normal breath sounds. No wheezing, rhonchi or rales.  Lymphadenopathy:     Head:     Right side of head: No submental, submandibular, tonsillar, preauricular, posterior auricular or occipital adenopathy.     Left side of head: No submental, submandibular, tonsillar, preauricular, posterior auricular or occipital adenopathy.     Cervical: No cervical adenopathy.  Skin:    General: Skin is warm and dry.  Neurological:     Mental Status: She is alert.  Psychiatric:        Speech: Speech normal.        Behavior: Behavior normal.        Thought Content: Thought content normal.        Assessment & Plan:   Problem List Items Addressed This Visit      Cardiovascular and Mediastinum   HTN (hypertension)    Mildly elevated today however in the setting of cough, patient taking Mucinex-D hesitant to change blood pressure regimen.  Education provided to not take any cough medication with a decongestant.  We will closely follow this.        Other   Depression, major, single episode, severe ([place])    Doing well on Zoloft, will continue      Cough - Primary    Duration approximately 6 weeks, differentials include GERD, bronchitis, ACE inhibitor side effect.  We jointly agreed we would start with Prilosec and if no improvement in cough, we will consider trial stop of lisinopril.  Patient does have a remote history of smoking, we had a discussion of a trial of Symbicort as well.  Close follow-up      Relevant Medications   omeprazole (PRILOSEC) 20 MG capsule   Other Relevant  Orders   DG Chest 2 View        I am having Danielle Humphrey start on omeprazole. I am also having her maintain her felodipine, ALPRAZolam, lisinopril-hydrochlorothiazide, and sertraline.   Meds ordered this encounter  Medications  . omeprazole (PRILOSEC) 20 MG capsule    Sig: Take 1 capsule (20 mg total) by mouth daily.    Dispense:  90 capsule    Refill:  1  Order Specific Question:   Supervising Provider    Answer:   Crecencio Mc [[id number]]    Return precautions given.   Risks, benefits, and alternatives of the medications and treatment plan prescribed today were discussed, and patient expressed understanding.   Education regarding symptom management and diagnosis given to patient on AVS.  Continue to follow with Burnard Hawthorne, FNP for routine health maintenance.   Danielle Humphrey and I agreed with plan.   Mable Paris, FNP

## 2018-10-11 NOTE — Assessment & Plan Note (Signed)
Duration approximately 6 weeks, differentials include GERD, bronchitis, ACE inhibitor side effect.  We jointly agreed we would start with Prilosec and if no improvement in cough, we will consider trial stop of lisinopril.  Patient does have a remote history of smoking, we had a discussion of a trial of Symbicort as well.  Close follow-up

## 2018-10-11 NOTE — Assessment & Plan Note (Signed)
Mildly elevated today however in the setting of cough, patient taking Mucinex-D hesitant to change blood pressure regimen.  Education provided to not take any cough medication with a decongestant.  We will closely follow this.

## 2018-10-23 ENCOUNTER — Telehealth: Payer: Self-pay | Admitting: *Deleted

## 2018-10-23 DIAGNOSIS — I10 Essential (primary) hypertension: Secondary | ICD-10-CM

## 2018-10-23 MED ORDER — HYDROCHLOROTHIAZIDE 25 MG PO TABS: 25.0000 mg | ORAL_TABLET | Freq: Every day | ORAL | 3 refills | Status: DC

## 2018-10-23 MED ORDER — LOSARTAN POTASSIUM 50 MG PO TABS: 50.0000 mg | ORAL_TABLET | Freq: Every day | ORAL | 3 refills | Status: DC

## 2018-10-23 NOTE — Telephone Encounter (Signed)
Call patient At that point, we can suspect that lisinopril may be contributing to what we call that "ACE cough".  I discontinued the combination pill lisinopril-hydrochlorothiazide.  I sent in a single prescription for hydrochlorothiazide 25 mg.  Also sent a prescription for losartan which is a different drug class 50 mg to be taken once a day.    She will need to repeat her blood work in 1 week after starting this new regimen.    Of course monitoring blood pressure is utmost importance as new medication regimen  may cause BP to elevate.

## 2018-10-23 NOTE — Telephone Encounter (Signed)
I spoke with patient & informed her of below. She stated that she was checking with pharmacy bc she received a VM that one of the medications sent was on back order. Once patient talks to pharmacy & is back to pick up script she will call back to make lab appointment.

## 2018-10-23 NOTE — Telephone Encounter (Signed)
Copied from [place]. Topic: General - Inquiry >> Oct 23, 2018 10:41 AM [name], NT wrote: Reason for CRM: patient is calling and states that she was seen on 10/11/18 for a cough and she states it is not better and she thinks it is in regards to the medication that was discussed at that visit.

## 2018-10-24 NOTE — Telephone Encounter (Signed)
The patient stated that the [facility] in [place] said the Losartan medication is on national back order.  So they will need the provider to prescribe something else. Patient is still taking the Lisinopril until the Losartan situation is straightened out.

## 2018-10-25 MED ORDER — TELMISARTAN 20 MG PO TABS: 20.0000 mg | ORAL_TABLET | Freq: Every day | ORAL | 1 refills | Status: DC

## 2018-10-25 NOTE — Telephone Encounter (Signed)
Call pt  I sent in telmisartan since losartan is on back order. Please ensure her labs are scheduled for about a week after trying this new blood pressure regimen

## 2018-10-25 NOTE — Telephone Encounter (Signed)
Patient notified & scheduled labs for Friday 11/01/18.

## 2018-11-01 ENCOUNTER — Other Ambulatory Visit: Payer: Self-pay | Admitting: Family

## 2018-11-01 ENCOUNTER — Other Ambulatory Visit (INDEPENDENT_AMBULATORY_CARE_PROVIDER_SITE_OTHER): Payer: No Typology Code available for payment source

## 2018-11-01 DIAGNOSIS — I10 Essential (primary) hypertension: Secondary | ICD-10-CM

## 2018-11-01 LAB — BASIC METABOLIC PANEL
BUN: 17 mg/dL (ref 6–23)
CO2: 26 mEq/L (ref 19–32)
Calcium: 9.9 mg/dL (ref 8.4–10.5)
Chloride: 101 mEq/L (ref 96–112)
Creatinine, Ser: 0.83 mg/dL (ref 0.40–1.20)
GFR: 70.26 mL/min (ref >60.00)
Glucose, Bld: 91 mg/dL (ref 70–99)
Potassium: 3.3 mEq/L — ABNORMAL LOW (ref 3.5–5.1)
Sodium: 138 mEq/L (ref 135–145)

## 2018-11-05 ENCOUNTER — Telehealth: Payer: Self-pay | Admitting: Family

## 2018-11-05 ENCOUNTER — Ambulatory Visit: Payer: Self-pay | Admitting: Family

## 2018-11-05 ENCOUNTER — Other Ambulatory Visit (INDEPENDENT_AMBULATORY_CARE_PROVIDER_SITE_OTHER): Payer: No Typology Code available for payment source

## 2018-11-05 DIAGNOSIS — I10 Essential (primary) hypertension: Secondary | ICD-10-CM

## 2018-11-05 DIAGNOSIS — E876 Hypokalemia: Secondary | ICD-10-CM

## 2018-11-05 LAB — BASIC METABOLIC PANEL
BUN: 18 mg/dL (ref 6–23)
CO2: 27 mEq/L (ref 19–32)
Calcium: 10 mg/dL (ref 8.4–10.5)
Chloride: 99 mEq/L (ref 96–112)
Creatinine, Ser: 0.84 mg/dL (ref 0.40–1.20)
GFR: 69.29 mL/min (ref >60.00)
Glucose, Bld: 89 mg/dL (ref 70–99)
POTASSIUM: 3 meq/L — AB (ref 3.5–5.1)
SODIUM: 136 meq/L (ref 135–145)

## 2018-11-05 MED ORDER — POTASSIUM CHLORIDE ER 10 MEQ PO TBCR: 10.0000 meq | EXTENDED_RELEASE_TABLET | Freq: Two times a day (BID) | ORAL | 0 refills | Status: DC

## 2018-11-05 NOTE — Telephone Encounter (Signed)
Call pt  Concerned with low potassium of 3 Sent in 5 tablets of potassium chloride 10 meq to be taken BID until run out  We can discuss in office tomorrow however advise 2 doses in TODAY  Advise to stop the HCTZ as think is lowering her potassium  Increase micardis to 40mg  ( see prior telephone note)  Advise ANY ha, sob, cp to go to ed

## 2018-11-05 NOTE — Telephone Encounter (Signed)
Call pt Advise her to make an appt ASAP any more ha, sob, cp, please advise ED  She may increase her micardis 20mg  to 40mg  and monitor BP. Schedule and order BMP on one week

## 2018-11-05 NOTE — Telephone Encounter (Signed)
Patient advised on below & scheduled appt tomorrow 1:15.

## 2018-11-05 NOTE — Telephone Encounter (Signed)
Pt states that the BP medication that was given it still running her BP high.  hydrochlorothiazide (HYDRODIURIL) 25 MG tablet telmisartan (MICARDIS) 20 MG tablet  Please advise?  [phone]. Thank you!

## 2018-11-05 NOTE — Telephone Encounter (Signed)
I spoke with patient & advised her on below. She stated that she will be here 1:15 tomorrow unless she has any other symptoms she will go to ED.

## 2018-11-06 ENCOUNTER — Encounter: Payer: Self-pay | Admitting: Family

## 2018-11-06 ENCOUNTER — Ambulatory Visit: Payer: No Typology Code available for payment source | Admitting: Family

## 2018-11-06 VITALS — BP 135/90 | HR 75 | Temp 97.9°F | Wt 198.8 lb

## 2018-11-06 DIAGNOSIS — R059 Cough, unspecified: Secondary | ICD-10-CM

## 2018-11-06 DIAGNOSIS — R05 Cough: Secondary | ICD-10-CM

## 2018-11-06 DIAGNOSIS — I10 Essential (primary) hypertension: Secondary | ICD-10-CM | POA: Diagnosis not present

## 2018-11-06 MED ORDER — TELMISARTAN 40 MG PO TABS: 40.0000 mg | ORAL_TABLET | Freq: Every day | ORAL | 1 refills | Status: DC

## 2018-11-06 NOTE — Patient Instructions (Addendum)
  Labs in one week; please schedule  Complete 5 doses of potassium chloride  Stay micardis 40mg   Monitor blood pressure,  Goal is less than 120/80, based on newest guidelines; if persistently higher, please make sooner follow up appointment so we can recheck you blood pressure and manage medications

## 2018-11-06 NOTE — Progress Notes (Signed)
Subjective:    Patient ID: Danielle Humphrey, female    DOB: 02-13-1961, 58 y.o.   MRN: [id number]  CC: Danielle Humphrey is a 58 y.o. female who presents today for an acute visit.    HPI: HTN- has been taking Kcl. Stopped HCTZ. Increased dose of micardis as of  today.  Yesterday 138/95 in both arms.   Cough from stopping lisinopril has improved.   Had HA 3 days ago, which has improved.   Denies exertional chest pain or pressure, numbness or tingling radiating to left arm or jaw, palpitations, dizziness, , changes in vision, or shortness of breath.    HISTORY:  Past Medical History:  Diagnosis Date  . Chickenpox 1960  . Hypercholesteremia 2017  . Hypertension 1980  . Mitral valve prolapse 2016  . Shingles 2016   Past Surgical History:  Procedure Laterality Date  . 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 APPENDECTOMY Bilateral 1968  . BREAST EXCISIONAL BIOPSY Bilateral 2005   benign  . BREAST LUMPECTOMY Bilateral 2005   Family History  Problem Relation Age of Onset  . Breast cancer Mother 31       2005  . Dementia Mother   . Stroke Mother   . Lupus Mother   . Hypercholesterolemia Father   . Heart attack Father        53  . Hypertension Father   . Diabetes type II Sister   . Heart disease Brother   . Ovarian cancer Neg Hx     Allergies: Codeine and Methotrexate derivatives Current Outpatient Medications on File Prior to Visit  Medication Sig Dispense Refill  . ALPRAZolam (XANAX) 0.5 MG tablet TAKE ONE TABLET BY MOUTH ONCE DAILY AS NEEDED FOR ANXIETY 30 tablet 1  . felodipine (PLENDIL) 10 MG 24 hr tablet Take 1 tablet (10 mg total) by mouth daily. 90 tablet 1  . omeprazole (PRILOSEC) 20 MG capsule Take 1 capsule (20 mg total) by mouth daily. 90 capsule 1  . sertraline (ZOLOFT) 100 MG tablet Take 1 tablet (100 mg total) by mouth at bedtime. 90 tablet 3   No current facility-administered medications on file prior to visit.     Social History   Tobacco Use  .  Smoking status: Never Smoker  . Smokeless tobacco: Never Used  Substance Use Topics  . Alcohol use: Yes    Alcohol/week: 7.0 standard drinks    Types: 3 Glasses of wine, 4 Cans of beer per week  . Drug use: No    Review of Systems  Constitutional: Negative for chills and fever.  HENT: Negative for congestion.   Respiratory: Positive for cough (improved). Negative for shortness of breath and wheezing.   Cardiovascular: Negative for chest pain and palpitations.  Gastrointestinal: Negative for nausea and vomiting.  Neurological: Positive for headaches (improved).      Objective:    BP 135/90   Pulse 75   Temp 97.9 F (36.6 C)   Wt 198 lb 12.8 oz (90.2 kg)   SpO2 98%   BMI 29.36 kg/m    Physical Exam Vitals signs reviewed.  Constitutional:      Appearance: She is well-developed.  Eyes:     Conjunctiva/sclera: Conjunctivae normal.  Cardiovascular:     Rate and Rhythm: Normal rate and regular rhythm.     Pulses: Normal pulses.     Heart sounds: Normal heart sounds.  Pulmonary:     Effort: Pulmonary effort is normal.  Breath sounds: Normal breath sounds. No wheezing, rhonchi or rales.  Skin:    General: Skin is warm and dry.  Neurological:     Mental Status: She is alert.  Psychiatric:        Speech: Speech normal.        Behavior: Behavior normal.        Thought Content: Thought content normal.        Assessment & Plan:    Problem List Items Addressed This Visit      Cardiovascular and Mediastinum   HTN (hypertension) - Primary    Improving on 40mg  micardis. dced hctz due to hypokalemia. Pending bmp in one week. She will monitor blood pressure at home.       Relevant Medications   telmisartan (MICARDIS) 40 MG tablet   Other Relevant Orders   Basic metabolic panel     Other   Cough    Improving off ACEI.  Declines pulmonary consult.           I have discontinued Kristine Royal. Buboltz's telmisartan and potassium chloride. I am also having her start on  telmisartan. Additionally, I am having her maintain her felodipine, ALPRAZolam, sertraline, and omeprazole.   Meds ordered this encounter  Medications  . telmisartan (MICARDIS) 40 MG tablet    Sig: Take 1 tablet (40 mg total) by mouth daily.    Dispense:  90 tablet    Refill:  1    Order Specific Question:   Supervising Provider    Answer:   Crecencio Mc [[id number]]    Return precautions given.   Risks, benefits, and alternatives of the medications and treatment plan prescribed today were discussed, and patient expressed understanding.   Education regarding symptom management and diagnosis given to patient on AVS.  Continue to follow with Burnard Hawthorne, FNP for routine health maintenance.   Danielle Humphrey and I agreed with plan.   Mable Paris, FNP

## 2018-11-06 NOTE — Telephone Encounter (Signed)
noted 

## 2018-11-06 NOTE — Assessment & Plan Note (Signed)
Improving on 40mg  micardis. dced hctz due to hypokalemia. Pending bmp in one week. She will monitor blood pressure at home.

## 2018-11-06 NOTE — Assessment & Plan Note (Signed)
Improving off ACEI.  Declines pulmonary consult.

## 2018-11-13 ENCOUNTER — Other Ambulatory Visit (INDEPENDENT_AMBULATORY_CARE_PROVIDER_SITE_OTHER): Payer: No Typology Code available for payment source

## 2018-11-13 DIAGNOSIS — I10 Essential (primary) hypertension: Secondary | ICD-10-CM

## 2018-11-13 LAB — BASIC METABOLIC PANEL
BUN: 19 mg/dL (ref 6–23)
CO2: 25 mEq/L (ref 19–32)
Calcium: 10 mg/dL (ref 8.4–10.5)
Chloride: 104 mEq/L (ref 96–112)
Creatinine, Ser: 0.86 mg/dL (ref 0.40–1.20)
GFR: 67.43 mL/min (ref >60.00)
Glucose, Bld: 97 mg/dL (ref 70–99)
Potassium: 3.8 mEq/L (ref 3.5–5.1)
SODIUM: 138 meq/L (ref 135–145)

## 2018-11-13 NOTE — Telephone Encounter (Signed)
This encounter was created in error - please disregard.

## 2018-12-03 ENCOUNTER — Ambulatory Visit: Payer: No Typology Code available for payment source | Admitting: Psychology

## 2018-12-18 ENCOUNTER — Other Ambulatory Visit: Payer: Self-pay | Admitting: Family

## 2018-12-18 DIAGNOSIS — F411 Generalized anxiety disorder: Secondary | ICD-10-CM

## 2018-12-18 NOTE — Telephone Encounter (Signed)
I looked up patient on [place] Controlled Substances Reporting System and saw no activity that raised concern of inappropriate use.   

## 2019-01-02 ENCOUNTER — Other Ambulatory Visit: Payer: Self-pay | Admitting: Family

## 2019-01-02 DIAGNOSIS — I10 Essential (primary) hypertension: Secondary | ICD-10-CM

## 2019-01-10 ENCOUNTER — Other Ambulatory Visit: Payer: Self-pay | Admitting: Family

## 2019-01-10 DIAGNOSIS — F322 Major depressive disorder, single episode, severe without psychotic features: Secondary | ICD-10-CM

## 2019-04-25 ENCOUNTER — Other Ambulatory Visit: Payer: Self-pay | Admitting: Family

## 2019-04-25 DIAGNOSIS — F411 Generalized anxiety disorder: Secondary | ICD-10-CM

## 2019-04-25 NOTE — Telephone Encounter (Signed)
Call pt   I have refilled your xanax  However I wanted to remind you that this is controlled substance.   In order for me to prescribe medication,  patients must be seen every 3-6 months.   Please make follow-up appointment and I look forward to seeing you.   Tobias Avitabile, NP  I looked up patient on [place] Controlled Substances Reporting System and saw no activity that raised concern of inappropriate use.

## 2019-04-30 NOTE — Telephone Encounter (Signed)
Patient has  Been scheduled for [facility] 05/05/19.

## 2019-05-05 ENCOUNTER — Other Ambulatory Visit: Payer: Self-pay

## 2019-05-05 ENCOUNTER — Ambulatory Visit (INDEPENDENT_AMBULATORY_CARE_PROVIDER_SITE_OTHER): Payer: No Typology Code available for payment source | Admitting: Family

## 2019-05-05 ENCOUNTER — Encounter: Payer: Self-pay | Admitting: Family

## 2019-05-05 DIAGNOSIS — Z1239 Encounter for other screening for malignant neoplasm of breast: Secondary | ICD-10-CM

## 2019-05-05 DIAGNOSIS — I10 Essential (primary) hypertension: Secondary | ICD-10-CM

## 2019-05-05 DIAGNOSIS — F411 Generalized anxiety disorder: Secondary | ICD-10-CM | POA: Diagnosis not present

## 2019-05-05 NOTE — Progress Notes (Signed)
This visit type was conducted due to national recommendations for restrictions regarding the COVID-19 pandemic (e.g. social distancing).  This format is felt to be most appropriate for this patient at this time.  All issues noted in this document were discussed and addressed.  No physical exam was performed (except for noted visual exam findings with Video Visits). Virtual Visit via Video Note  I connected with@  on 05/05/19 at  8:30 AM EDT by a video enabled telemedicine application and verified that I am speaking with the correct person using two identifiers.  Location patient: home Location provider:work  Persons participating in the virtual visit: patient, provider  I discussed the limitations of evaluation and management by telemedicine and the availability of in person appointments. The patient expressed understanding and agreed to proceed.   HPI:  Overall coping well with pandemic. Feels well. No new concerns.  GAD- doing well on xanax, zoloft. Using xanax using 1-2 per week.   HTN- Hasnt checked blood pressure. Compliant 40mg  micardis.Denies exertional chest pain or pressure, numbness or tingling radiating to left arm or jaw, palpitations, dizziness, frequent headaches, changes in vision, or shortness of breath.   Cough resolved off ACE-I.   No longer needing prilosec. Has stopped drinking wine.  Lost 12 lbs.   Declines colonoscopy at this time  Due mammogram; due to insurance, has to have at [place] now.  ROS: See pertinent positives and negatives per HPI.  Past Medical History:  Diagnosis Date  . Chickenpox 1960  . Hypercholesteremia 2017  . Hypertension 1980  . Mitral valve prolapse 2016  . Shingles 2016    Past Surgical History:  Procedure Laterality Date  . 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 APPENDECTOMY Bilateral 1968  . BREAST EXCISIONAL BIOPSY Bilateral 2005   benign  . BREAST LUMPECTOMY Bilateral 2005    Family History  Problem Relation  Age of Onset  . Breast cancer Mother 86       2005  . Dementia Mother   . Stroke Mother   . Lupus Mother   . Hypercholesterolemia Father   . Heart attack Father        64  . Hypertension Father   . Diabetes type II Sister   . Heart disease Brother   . Ovarian cancer Neg Hx     SOCIAL HX: never smoker   Current Outpatient Medications:  .  ALPRAZolam (XANAX) 0.5 MG tablet, TAKE ONE TABLET BY MOUTH ONCE DAILY AS NEEDED FOR ANXIETY, Disp: 30 tablet, Rfl: 1 .  felodipine (PLENDIL) 10 MG 24 hr tablet, TAKE 1 TABLET BY MOUTH EVERY DAY, Disp: 30 tablet, Rfl: 5 .  sertraline (ZOLOFT) 100 MG tablet, TAKE 1 TABLET BY MOUTH EVERYDAY AT BEDTIME, Disp: 90 tablet, Rfl: 4 .  telmisartan (MICARDIS) 40 MG tablet, Take 1 tablet (40 mg total) by mouth daily., Disp: 90 tablet, Rfl: 1 .  omeprazole (PRILOSEC) 20 MG capsule, Take 1 capsule (20 mg total) by mouth daily. (Patient not taking: Reported on 05/05/2019), Disp: 90 capsule, Rfl: 1  EXAM:  VITALS per patient if applicable:  GENERAL: alert, oriented, appears well and in no acute distress  HEENT: atraumatic, conjunttiva clear, no obvious abnormalities on inspection of external nose and ears  NECK: normal movements of the head and neck  LUNGS: on inspection no signs of respiratory distress, breathing rate appears normal, no obvious gross SOB, gasping or wheezing  CV: no obvious cyanosis  MS: moves all visible extremities without  noticeable abnormality  PSYCH/NEURO: pleasant and cooperative, no obvious depression or anxiety, speech and thought processing grossly intact  ASSESSMENT AND PLAN:  Discussed the following assessment and plan:  Problem List Items Addressed This Visit      Cardiovascular and Mediastinum   HTN (hypertension) - Primary    BP Readings from Last 3 Encounters:  11/06/18 135/90  10/11/18 135/85  08/16/18 118/74   Suspect at goal. She will monitor at home and let me know.         Other   Generalized anxiety  disorder    Doing well on current regimen. Using xanax appropriately and sparingly; will continue.        Other Visit Diagnoses    Screening for breast cancer       Relevant Orders   MM 3D SCREEN BREAST BILATERAL    Of note: patient will schedule mammogram.   I discussed the assessment and treatment plan with the patient. The patient was provided an opportunity to ask questions and all were answered. The patient agreed with the plan and demonstrated an understanding of the instructions.   The patient was advised to call back or seek an in-person evaluation if the symptoms worsen or if the condition fails to improve as anticipated.   Mable Paris, FNP

## 2019-05-05 NOTE — Assessment & Plan Note (Signed)
Doing well on current regimen. Using xanax appropriately and sparingly; will continue.

## 2019-05-05 NOTE — Assessment & Plan Note (Signed)
BP Readings from Last 3 Encounters:  11/06/18 135/90  10/11/18 135/85  08/16/18 118/74   Suspect at goal. She will monitor at home and let me know.

## 2019-05-05 NOTE — Patient Instructions (Addendum)
Please call call and schedule your 3D mammogram as discussed. You will need to request records from [facility] and have them sent to [facility].    [facility]  [address], [place]   Let me know when you are ready for colonoscopy and I will order.   Monitor blood pressure,  Goal is less than 120/80, based on newest guidelines; if persistently higher, please make sooner follow up appointment so we can recheck you blood pressure and manage medications  Stay safe!

## 2019-05-09 ENCOUNTER — Other Ambulatory Visit: Payer: Self-pay | Admitting: Family

## 2019-05-09 DIAGNOSIS — I10 Essential (primary) hypertension: Secondary | ICD-10-CM

## 2019-05-16 NOTE — Telephone Encounter (Signed)
err

## 2019-06-24 ENCOUNTER — Other Ambulatory Visit: Payer: Self-pay | Admitting: Family

## 2019-06-24 DIAGNOSIS — I10 Essential (primary) hypertension: Secondary | ICD-10-CM

## 2019-09-21 ENCOUNTER — Other Ambulatory Visit: Payer: Self-pay | Admitting: Family

## 2019-09-21 DIAGNOSIS — F411 Generalized anxiety disorder: Secondary | ICD-10-CM

## 2019-09-22 NOTE — Telephone Encounter (Signed)
Call pt   I have refilled your xanax  However I wanted to remind you that this is controlled substance.   In order for me to prescribe medication,  patients must be seen every 3 months.   Please make follow-up appointment this month for any further refills.    I looked up patient on [place] Controlled Substances Reporting System and saw no activity that raised concern of inappropriate use.   

## 2019-10-14 NOTE — Telephone Encounter (Signed)
Patient scheduled for follow-up appointment 11/11/19.

## 2019-11-04 ENCOUNTER — Ambulatory Visit
Admission: RE | Admit: 2019-11-04 | Discharge: 2019-11-04 | Disposition: A | Discharge status: Home / Self Care | Payer: No Typology Code available for payment source | Source: Ambulatory Visit | Attending: Family | Admitting: Family

## 2019-11-04 ENCOUNTER — Other Ambulatory Visit: Payer: Self-pay | Admitting: Family

## 2019-11-04 DIAGNOSIS — Z1231 Encounter for screening mammogram for malignant neoplasm of breast: Secondary | ICD-10-CM | POA: Insufficient documentation

## 2019-11-04 DIAGNOSIS — I10 Essential (primary) hypertension: Secondary | ICD-10-CM

## 2019-11-04 DIAGNOSIS — Z1239 Encounter for other screening for malignant neoplasm of breast: Secondary | ICD-10-CM

## 2019-11-10 ENCOUNTER — Telehealth: Payer: Self-pay

## 2019-11-10 NOTE — Telephone Encounter (Signed)
LM letting patient know that I sent link for VV but that she did not need to log on until tomorrow at 8am. I wanted to go over meds & any concerns but was unable to reach. Will speak with patient in the morning.

## 2019-11-11 ENCOUNTER — Encounter: Payer: Self-pay | Admitting: Family

## 2019-11-11 ENCOUNTER — Other Ambulatory Visit: Payer: Self-pay

## 2019-11-11 ENCOUNTER — Ambulatory Visit (INDEPENDENT_AMBULATORY_CARE_PROVIDER_SITE_OTHER): Payer: No Typology Code available for payment source | Admitting: Family

## 2019-11-11 VITALS — Ht 70.0 in | Wt 190.0 lb

## 2019-11-11 DIAGNOSIS — F411 Generalized anxiety disorder: Secondary | ICD-10-CM | POA: Diagnosis not present

## 2019-11-11 DIAGNOSIS — I10 Essential (primary) hypertension: Secondary | ICD-10-CM | POA: Diagnosis not present

## 2019-11-11 DIAGNOSIS — Z1211 Encounter for screening for malignant neoplasm of colon: Secondary | ICD-10-CM | POA: Diagnosis not present

## 2019-11-11 NOTE — Assessment & Plan Note (Addendum)
BP Readings from Last 3 Encounters:  11/06/18 135/90  10/11/18 135/85  08/16/18 118/74   Perhaps slightly elevated. Patient will spot check at home and we will adjust from there.

## 2019-11-11 NOTE — Progress Notes (Signed)
Virtual Visit via Video Note  I connected with@  on 11/11/19 at  8:00 AM EST by a video enabled telemedicine application and verified that I am speaking with the correct person using two identifiers.  Location patient: home Location provider:work  Persons participating in the virtual visit: patient, provider  I discussed the limitations of evaluation and management by telemedicine and the availability of in person appointments. The patient expressed understanding and agreed to proceed.   HPI: Feels well. No complaints  HTN- hasnt checked in a long time. Compliant with medication. Denies exertional chest pain or pressure, numbness or tingling radiating to left arm or jaw, palpitations, dizziness, frequent headaches, changes in vision, or shortness of breath.   GAD- feels well on zoloft. Sleeping okay. Takes xanax 1-2 per week.   MM UTD Pap Due  ROS: See pertinent positives and negatives per HPI.  Past Medical History:  Diagnosis Date  . Chickenpox 1960  . Hypercholesteremia 2017  . Hypertension 1980  . Mitral valve prolapse 2016  . Shingles 2016    Past Surgical History:  Procedure Laterality Date  . 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 APPENDECTOMY Bilateral 1968  . BREAST EXCISIONAL BIOPSY Bilateral 2005   benign  . BREAST LUMPECTOMY Bilateral 2005    Family History  Problem Relation Age of Onset  . Breast cancer Mother 55       2005  . Dementia Mother   . Stroke Mother   . Lupus Mother   . Hypercholesterolemia Father   . Heart attack Father        57  . Hypertension Father   . Diabetes type II Sister   . Heart disease Brother   . Ovarian cancer Neg Hx     SOCIAL HX: never smoker   Current Outpatient Medications:  .  ALPRAZolam (XANAX) 0.5 MG tablet, TAKE ONE TABLET BY MOUTH ONCE DAILY AS NEEDED FOR ANXIETY, Disp: 30 tablet, Rfl: 1 .  felodipine (PLENDIL) 10 MG 24 hr tablet, TAKE 1 TABLET BY MOUTH EVERY DAY, Disp: 30 tablet, Rfl: 5 .   sertraline (ZOLOFT) 100 MG tablet, TAKE 1 TABLET BY MOUTH EVERYDAY AT BEDTIME, Disp: 90 tablet, Rfl: 4 .  telmisartan (MICARDIS) 40 MG tablet, TAKE 1 TABLET BY MOUTH EVERY DAY, Disp: 30 tablet, Rfl: 5  EXAM:  VITALS per patient if applicable:  GENERAL: alert, oriented, appears well and in no acute distress  HEENT: atraumatic, conjunttiva clear, no obvious abnormalities on inspection of external nose and ears  NECK: normal movements of the head and neck  LUNGS: on inspection no signs of respiratory distress, breathing rate appears normal, no obvious gross SOB, gasping or wheezing  CV: no obvious cyanosis  MS: moves all visible extremities without noticeable abnormality  PSYCH/NEURO: pleasant and cooperative, no obvious depression or anxiety, speech and thought processing grossly intact  ASSESSMENT AND PLAN:  Discussed the following assessment and plan:  Encounter for screening colonoscopy - Plan: Ambulatory referral to Gastroenterology  Hypertension, unspecified type - Plan: CBC with Differential/Platelet, Comprehensive metabolic panel, Hemoglobin A1c, Lipid panel, VITAMIN D 25 Hydroxy (Vit-D Deficiency, Fractures), TSH  Generalized anxiety disorder Problem List Items Addressed This Visit      Cardiovascular and Mediastinum   HTN (hypertension)    BP Readings from Last 3 Encounters:  11/06/18 135/90  10/11/18 135/85  08/16/18 118/74   Perhaps slightly elevated. Patient will spot check at home and we will adjust from there.  Relevant Orders   CBC with Differential/Platelet   Comprehensive metabolic panel   Hemoglobin A1c   Lipid panel   VITAMIN D 25 Hydroxy (Vit-D Deficiency, Fractures)   TSH     Other   Generalized anxiety disorder    Controlled, continue current regimen.        Other Visit Diagnoses    Encounter for screening colonoscopy    -  Primary   Relevant Orders   Ambulatory referral to Gastroenterology      -we discussed possible serious and  likely etiologies, options for evaluation and workup, limitations of telemedicine visit vs in person visit, treatment, treatment risks and precautions. Pt prefers to treat via telemedicine empirically rather then risking or undertaking an in person visit at this moment. Patient agrees to seek prompt in person care if worsening, new symptoms arise, or if is not improving with treatment.   I discussed the assessment and treatment plan with the patient. The patient was provided an opportunity to ask questions and all were answered. The patient agreed with the plan and demonstrated an understanding of the instructions.   The patient was advised to call back or seek an in-person evaluation if the symptoms worsen or if the condition fails to improve as anticipated.   Mable Paris, FNP

## 2019-11-11 NOTE — Assessment & Plan Note (Signed)
Controlled, continue current regimen

## 2019-11-18 ENCOUNTER — Telehealth: Payer: Self-pay

## 2019-11-18 ENCOUNTER — Other Ambulatory Visit: Payer: Self-pay

## 2019-11-18 DIAGNOSIS — Z1211 Encounter for screening for malignant neoplasm of colon: Secondary | ICD-10-CM

## 2019-11-18 NOTE — Telephone Encounter (Signed)
Gastroenterology Pre-Procedure Review  Request Date: Friday 12/12/19 Requesting Physician: Dr. Marius Ditch  PATIENT REVIEW QUESTIONS: The patient responded to the following health history questions as indicated:    1. Are you having any GI issues? no 2. Do you have a personal history of Polyps? no 3. Do you have a family history of Colon Cancer or Polyps? no 4. Diabetes Mellitus? no 5. Joint replacements in the past 12 months?no 6. Major health problems in the past 3 months?no 7. Any artificial heart valves, MVP, or defibrillator?no    MEDICATIONS & ALLERGIES:    Patient reports the following regarding taking any anticoagulation/antiplatelet therapy:   Plavix, Coumadin, Eliquis, Xarelto, Lovenox, Pradaxa, Brilinta, or Effient? no Aspirin? no  Patient confirms/reports the following medications:  Current Outpatient Medications  Medication Sig Dispense Refill  . ALPRAZolam (XANAX) 0.5 MG tablet TAKE ONE TABLET BY MOUTH ONCE DAILY AS NEEDED FOR ANXIETY 30 tablet 1  . felodipine (PLENDIL) 10 MG 24 hr tablet TAKE 1 TABLET BY MOUTH EVERY DAY 30 tablet 5  . sertraline (ZOLOFT) 100 MG tablet TAKE 1 TABLET BY MOUTH EVERYDAY AT BEDTIME 90 tablet 4  . telmisartan (MICARDIS) 40 MG tablet TAKE 1 TABLET BY MOUTH EVERY DAY 30 tablet 5   No current facility-administered medications for this visit.    Patient confirms/reports the following allergies:  Allergies  Allergen Reactions  . Codeine Anaphylaxis  . Methotrexate Derivatives Anaphylaxis    No orders of the defined types were placed in this encounter.   AUTHORIZATION INFORMATION Primary Insurance: 1D#: Group #:  Secondary Insurance: 1D#: Group #:  SCHEDULE INFORMATION: Date: 12/12/19 Time: Location:[facility]

## 2019-11-24 ENCOUNTER — Other Ambulatory Visit: Payer: Self-pay

## 2019-11-24 ENCOUNTER — Other Ambulatory Visit (INDEPENDENT_AMBULATORY_CARE_PROVIDER_SITE_OTHER): Payer: No Typology Code available for payment source

## 2019-11-24 ENCOUNTER — Encounter: Payer: Self-pay | Admitting: Family

## 2019-11-24 ENCOUNTER — Other Ambulatory Visit: Payer: Self-pay | Admitting: Family

## 2019-11-24 DIAGNOSIS — I10 Essential (primary) hypertension: Secondary | ICD-10-CM | POA: Diagnosis not present

## 2019-11-24 DIAGNOSIS — R899 Unspecified abnormal finding in specimens from other organs, systems and tissues: Secondary | ICD-10-CM

## 2019-11-24 LAB — COMPREHENSIVE METABOLIC PANEL
ALT: 12 U/L (ref 0–35)
AST: 14 U/L (ref 0–37)
Albumin: 4.1 g/dL (ref 3.5–5.2)
Alkaline Phosphatase: 41 U/L (ref 39–117)
BUN: 16 mg/dL (ref 6–23)
CO2: 25 mEq/L (ref 19–32)
Calcium: 10 mg/dL (ref 8.4–10.5)
Chloride: 102 mEq/L (ref 96–112)
Creatinine, Ser: 0.83 mg/dL (ref 0.40–1.20)
GFR: 70.01 mL/min (ref >60.00)
Glucose, Bld: 87 mg/dL (ref 70–99)
Potassium: 3.7 mEq/L (ref 3.5–5.1)
Sodium: 137 mEq/L (ref 135–145)
Total Bilirubin: 0.5 mg/dL (ref 0.2–1.2)
Total Protein: 7 g/dL (ref 6.0–8.3)

## 2019-11-24 LAB — CBC WITH DIFFERENTIAL/PLATELET
Basophils Absolute: 0 10*3/uL (ref 0.0–0.1)
Basophils Relative: 0.4 % (ref 0.0–3.0)
Eosinophils Absolute: 0.2 10*3/uL (ref 0.0–0.7)
Eosinophils Relative: 2.6 % (ref 0.0–5.0)
HCT: 38.8 % (ref 36.0–46.0)
Hemoglobin: 13 g/dL (ref 12.0–15.0)
Lymphocytes Relative: 46.3 % — ABNORMAL HIGH (ref 12.0–46.0)
Lymphs Abs: 3.2 10*3/uL (ref 0.7–4.0)
MCHC: 33.6 g/dL (ref 30.0–36.0)
MCV: 91.8 fl (ref 78.0–100.0)
Monocytes Absolute: 0.4 10*3/uL (ref 0.1–1.0)
Monocytes Relative: 6.5 % (ref 3.0–12.0)
Neutro Abs: 3 10*3/uL (ref 1.4–7.7)
Neutrophils Relative %: 44.2 % (ref 43.0–77.0)
Platelets: 209 10*3/uL (ref 150.0–400.0)
RBC: 4.23 Mil/uL (ref 3.87–5.11)
RDW: 12.9 % (ref 11.5–15.5)
WBC: 6.8 10*3/uL (ref 4.0–10.5)

## 2019-11-24 LAB — HEMOGLOBIN A1C: Hgb A1c MFr Bld: 5.5 % (ref 4.6–6.5)

## 2019-11-24 LAB — LIPID PANEL
Cholesterol: 274 mg/dL — ABNORMAL HIGH (ref 0–200)
HDL: 41.9 mg/dL (ref >39.00)
NonHDL: 231.81
Total CHOL/HDL Ratio: 7
Triglycerides: 343 mg/dL — ABNORMAL HIGH (ref 0.0–149.0)
VLDL: 68.6 mg/dL — ABNORMAL HIGH (ref 0.0–40.0)

## 2019-11-24 LAB — VITAMIN D 25 HYDROXY (VIT D DEFICIENCY, FRACTURES): VITD: 23.97 ng/mL — ABNORMAL LOW (ref 30.00–100.00)

## 2019-11-24 LAB — LDL CHOLESTEROL, DIRECT: Direct LDL: 148 mg/dL

## 2019-11-24 LAB — TSH: TSH: 5.54 u[IU]/mL — ABNORMAL HIGH (ref 0.35–4.50)

## 2019-11-27 ENCOUNTER — Other Ambulatory Visit: Payer: Self-pay | Admitting: Family

## 2019-11-27 DIAGNOSIS — I10 Essential (primary) hypertension: Secondary | ICD-10-CM

## 2019-11-28 ENCOUNTER — Other Ambulatory Visit: Payer: Self-pay

## 2019-11-28 MED ORDER — ROSUVASTATIN CALCIUM 10 MG PO TABS: 10.0000 mg | ORAL_TABLET | ORAL | 3 refills | Status: DC

## 2019-12-02 ENCOUNTER — Encounter: Payer: Self-pay | Admitting: Cardiology

## 2019-12-02 ENCOUNTER — Ambulatory Visit (INDEPENDENT_AMBULATORY_CARE_PROVIDER_SITE_OTHER): Payer: No Typology Code available for payment source | Admitting: Cardiology

## 2019-12-02 ENCOUNTER — Other Ambulatory Visit: Payer: Self-pay

## 2019-12-02 VITALS — BP 130/94 | HR 68 | Ht 70.0 in | Wt 192.5 lb

## 2019-12-02 DIAGNOSIS — I10 Essential (primary) hypertension: Secondary | ICD-10-CM

## 2019-12-02 DIAGNOSIS — I340 Nonrheumatic mitral (valve) insufficiency: Secondary | ICD-10-CM

## 2019-12-02 DIAGNOSIS — E7800 Pure hypercholesterolemia, unspecified: Secondary | ICD-10-CM | POA: Diagnosis not present

## 2019-12-02 NOTE — Patient Instructions (Signed)
Medication Instructions:  Your physician recommends that you continue on your current medications as directed. Please refer to the Current Medication list given to you today.  *If you need a refill on your cardiac medications before your next appointment, please call your pharmacy*   Lab Work: none If you have labs (blood work) drawn today and your tests are completely normal, you will receive your results only by: Marland Kitchen MyChart Message (if you have MyChart) OR . A paper copy in the mail If you have any lab test that is abnormal or we need to change your treatment, we will call you to review the results.   Testing/Procedures: Your physician has requested that you have an echocardiogram. Echocardiography is a painless test that uses sound waves to create images of your heart. It provides your doctor with information about the size and shape of your heart and how well your heart's chambers and valves are working. This procedure takes approximately one hour. There are no restrictions for this procedure. You may get an IV, if needed, to receive an ultrasound enhancing agent through to better visualize your heart.   Follow-Up: At [facility], you and your health needs are our priority.  As part of our continuing mission to provide you with exceptional heart care, we have created designated Provider Care Teams.  These Care Teams include your primary Cardiologist (physician) and Advanced Practice Providers (APPs -  Physician Assistants and Nurse Practitioners) who all work together to provide you with the care you need, when you need it.  We recommend signing up for the patient portal called "MyChart".  Sign up information is provided on this After Visit Summary.  MyChart is used to connect with patients for Virtual Visits (Telemedicine).  Patients are able to view lab/test results, encounter notes, upcoming appointments, etc.  Non-urgent messages can be sent to your provider as well.   To learn more  about what you can do with MyChart, go to [email or web address].    Your next appointment:   4 week(s)  Ok if echo is not done yet per Dr. Garen Lah.  The format for your next appointment:   In Person  Provider:    You may see Kate Sable, MD or one of the following Advanced Practice Providers on your designated Care Team:    Murray Hodgkins, NP  Christell Faith, PA-C  Marrianne Mood, PA-C   Echocardiogram An echocardiogram is a procedure that uses painless sound waves (ultrasound) to produce an image of the heart. Images from an echocardiogram can provide important information about:  Signs of coronary artery disease (CAD).  Aneurysm detection. An aneurysm is a weak or damaged part of an artery wall that bulges out from the normal force of blood pumping through the body.  Heart size and shape. Changes in the size or shape of the heart can be associated with certain conditions, including heart failure, aneurysm, and CAD.  Heart muscle function.  Heart valve function.  Signs of a past heart attack.  Fluid buildup around the heart.  Thickening of the heart muscle.  A tumor or infectious growth around the heart valves. Tell a health care provider about:  Any allergies you have.  All medicines you are taking, including vitamins, herbs, eye drops, creams, and over-the-counter medicines.  Any blood disorders you have.  Any surgeries you have had.  Any medical conditions you have.  Whether you are pregnant or may be pregnant. What are the risks? Generally, this is a safe procedure. However,  problems may occur, including:  Allergic reaction to dye (contrast) that may be used during the procedure. What happens before the procedure? No specific preparation is needed. You may eat and drink normally. What happens during the procedure?   An IV tube may be inserted into one of your veins.  You may receive contrast through this tube. A contrast is an injection  that improves the quality of the pictures from your heart.  A gel will be applied to your chest.  A wand-like tool (transducer) will be moved over your chest. The gel will help to transmit the sound waves from the transducer.  The sound waves will harmlessly bounce off of your heart to allow the heart images to be captured in real-time motion. The images will be recorded on a computer. The procedure may vary among health care providers and hospitals. What happens after the procedure?  You may return to your normal, everyday life, including diet, activities, and medicines, unless your health care provider tells you not to do that. Summary  An echocardiogram is a procedure that uses painless sound waves (ultrasound) to produce an image of the heart.  Images from an echocardiogram can provide important information about the size and shape of your heart, heart muscle function, heart valve function, and fluid buildup around your heart.  You do not need to do anything to prepare before this procedure. You may eat and drink normally.  After the echocardiogram is completed, you may return to your normal, everyday life, unless your health care provider tells you not to do that. This information is not intended to replace advice given to you by your health care provider. Make sure you discuss any questions you have with your health care provider. Document Revised: 01/02/2019 Document Reviewed: 10/14/2016 Elsevier Patient Education  [place].

## 2019-12-02 NOTE — Progress Notes (Signed)
Cardiology Office Note:    Date:  12/02/2019   ID:  [place], Finamore [birth year], MRN [id number]  PCP:  Burnard Hawthorne, FNP  Cardiologist:  Kate Sable, MD  Electrophysiologist:  None   Referring MD: Burnard Hawthorne, FNP   Chief Complaint  Patient presents with  . New Patient (Initial Visit)    HTN/elevated triglycerides; Meds verbally reviewed with patient.   Danielle Humphrey is a 59 y.o. female who is being seen today for the evaluation of hyperlipidemia at the request of Vidal Schwalbe Yvetta Coder, FNP.  History of Present Illness:    Danielle Humphrey is a 59 y.o. female with a hx of mild MR, hypertension, hyperlipidemia who presents due to abnormal lipid levels.  Patient has a history of high cholesterol levels, she states her lipid panel is worsened due to not eating right.  She used to take statin but developed muscle aches and stopped.  She does not remember the name of the statin.  Her primary care provider started her on Crestor 10 mg which she took this week.  The plan is to slowly titrate patient over the next several weeks to 3 times weekly.  Patient states having a history of mitral regurg.  Last TTEchocardiogram in 2015 showed mild mitral regurgitation.  She used to be seen at [facility] but lost to follow-up.  She denies any symptoms of chest pain or shortness of breath at rest or with exertion.  Past Medical History:  Diagnosis Date  . Chickenpox 1960  . Hypercholesteremia 2017  . Hypertension 1980  . Mitral valve prolapse 2016  . Shingles 2016    Past Surgical History:  Procedure Laterality Date  . 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 APPENDECTOMY Bilateral 1968  . BREAST EXCISIONAL BIOPSY Bilateral 2005   benign  . BREAST LUMPECTOMY Bilateral 2005    Current Medications: Current Meds  Medication Sig  . ALPRAZolam (XANAX) 0.5 MG tablet TAKE ONE TABLET BY MOUTH ONCE DAILY AS NEEDED FOR ANXIETY  . felodipine (PLENDIL) 10 MG 24 hr tablet TAKE 1 TABLET  BY MOUTH EVERY DAY  . rosuvastatin (CRESTOR) 10 MG tablet Take 1 tablet (10 mg total) by mouth 3 (three) times a week.  . sertraline (ZOLOFT) 100 MG tablet TAKE 1 TABLET BY MOUTH EVERYDAY AT BEDTIME  . telmisartan (MICARDIS) 40 MG tablet TAKE 1 TABLET BY MOUTH EVERY DAY     Allergies:   Codeine and Methotrexate derivatives   Social History   Socioeconomic History  . Marital status: Married    Spouse name: Zylee Malicoat  . Number of children: 2  . Years of education: 2  . Highest education level: Not on file  Occupational History  . Occupation: [employer]  Tobacco Use  . Smoking status: Never Smoker  . Smokeless tobacco: Never Used  Substance and Sexual Activity  . Alcohol use: Yes    Alcohol/week: 7.0 standard drinks    Types: 3 Glasses of wine, 4 Cans of beer per week    Comment: weekly  . Drug use: No  . Sexual activity: Yes  Other Topics Concern  . 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 Difficulty  of Paying Living Expenses: Not on file  Food Insecurity:   . Worried About Charity fundraiser in the Last Year: Not on file  . Ran Out of Food in the Last Year: Not on file  Transportation Needs:   . Lack of Transportation (Medical): Not on file  . Lack of Transportation (Non-Medical): Not on file  Physical Activity:   . Days of Exercise per Week: Not on file  . Minutes of Exercise per Session: Not on file  Stress:   . Feeling of Stress : Not on file  Social Connections:   . Frequency of Communication with Friends and Family: Not on file  . Frequency of Social Gatherings with Friends and Family: Not on file  . Attends Religious Services: Not on file  . Active Member of Clubs or Organizations: Not on file  . Attends Archivist Meetings: Not on file  .  Marital Status: Not on file     Family History: The patient's family history includes Breast cancer (age of onset: 65) in her mother; Dementia in her mother; Diabetes type II in her sister; Heart attack in her father; Heart disease in her brother; Hypercholesterolemia in her father; Hypertension in her father; Lupus in her mother; Stroke in her mother. There is no history of Ovarian cancer.  ROS:   Please see the history of present illness.     All other systems reviewed and are negative.  EKGs/Labs/Other Studies Reviewed:    The following studies were reviewed today:   EKG:  EKG is  ordered today.  The ekg ordered today demonstrates normal sinus rhythm, normal ECG.  Recent Labs: 11/24/2019: ALT 12; BUN 16; Creatinine, Ser 0.83; Hemoglobin 13.0; Platelets 209.0; Potassium 3.7; Sodium 137; TSH 5.54  Recent Lipid Panel    Component Value Date/Time   CHOL 274 (H) 11/24/2019 0809   TRIG 343.0 (H) 11/24/2019 0809   HDL 41.90 11/24/2019 0809   CHOLHDL 7 11/24/2019 0809   VLDL 68.6 (H) 11/24/2019 0809   LDLCALC 158 (H) 07/05/2018 0859   LDLDIRECT 148.0 11/24/2019 0809    Physical Exam:    VS:  BP (!) 130/94 (BP Location: Right Arm, Patient Position: Sitting, Cuff Size: Normal)   Pulse 68   Ht 5\' 10"  (1.778 m)   Wt 192 lb 8 oz (87.3 kg)   SpO2 98%   BMI 27.62 kg/m     Wt Readings from Last 3 Encounters:  12/02/19 192 lb 8 oz (87.3 kg)  11/11/19 190 lb (86.2 kg)  11/06/18 198 lb 12.8 oz (90.2 kg)     GEN:  Well nourished, well developed in no acute distress HEENT: Normal NECK: No JVD; No carotid bruits LYMPHATICS: No lymphadenopathy CARDIAC: RRR, no murmurs, rubs, gallops RESPIRATORY:  Clear to auscultation without rales, wheezing or rhonchi  ABDOMEN: Soft, non-tender, non-distended MUSCULOSKELETAL:  No edema; No deformity  SKIN: Warm and dry NEUROLOGIC:  Alert and oriented x 3 PSYCHIATRIC:  Normal affect   ASSESSMENT:    1. Mitral valve insufficiency, unspecified  etiology   2. Pure hypercholesterolemia   3. Essential hypertension    PLAN:    In order of problems listed above:  1. Patient with history of mild mitral valve regurgitation.  Last echocardiogram was 5 years ago.  Will obtain repeat echocardiogram to evaluate any change in mitral valve regurg severity. 2. Patient has history of hyperlipidemia.  10-year ASCVD risk score 7.1.  High triglycerides especially elevated.  She has a history of  statin intolerance.  She was started on Crestor 10 mg to slowly titrate 3 times a week.  Further management dependent on patient's tolerance to statin.  If she is not tolerant to Crestor, will plan for a trial of Pravachol instead. 3. Blood pressure reasonably controlled.  Continue current BP meds.  Follow-up in 1 month for cholesterol medication adjustment.  This note was generated in part or whole with voice recognition software. Voice recognition is usually quite accurate but there are transcription errors that can and very often do occur. I apologize for any typographical errors that were not detected and corrected.  Medication Adjustments/Labs and Tests Ordered: Current medicines are reviewed at length with the patient today.  Concerns regarding medicines are outlined above.  Orders Placed This Encounter  Procedures  . EKG 12-Lead  . ECHOCARDIOGRAM COMPLETE   No orders of the defined types were placed in this encounter.   Patient Instructions  Medication Instructions:  Your physician recommends that you continue on your current medications as directed. Please refer to the Current Medication list given to you today.  *If you need a refill on your cardiac medications before your next appointment, please call your pharmacy*   Lab Work: none If you have labs (blood work) drawn today and your tests are completely normal, you will receive your results only by: Marland Kitchen MyChart Message (if you have MyChart) OR . A paper copy in the mail If you have any  lab test that is abnormal or we need to change your treatment, we will call you to review the results.   Testing/Procedures: Your physician has requested that you have an echocardiogram. Echocardiography is a painless test that uses sound waves to create images of your heart. It provides your doctor with information about the size and shape of your heart and how well your heart's chambers and valves are working. This procedure takes approximately one hour. There are no restrictions for this procedure. You may get an IV, if needed, to receive an ultrasound enhancing agent through to better visualize your heart.   Follow-Up: At [facility], you and your health needs are our priority.  As part of our continuing mission to provide you with exceptional heart care, we have created designated Provider Care Teams.  These Care Teams include your primary Cardiologist (physician) and Advanced Practice Providers (APPs -  Physician Assistants and Nurse Practitioners) who all work together to provide you with the care you need, when you need it.  We recommend signing up for the patient portal called "MyChart".  Sign up information is provided on this After Visit Summary.  MyChart is used to connect with patients for Virtual Visits (Telemedicine).  Patients are able to view lab/test results, encounter notes, upcoming appointments, etc.  Non-urgent messages can be sent to your provider as well.   To learn more about what you can do with MyChart, go to [email or web address].    Your next appointment:   4 week(s)  Ok if echo is not done yet per Dr. Garen Lah.  The format for your next appointment:   In Person  Provider:    You may see Kate Sable, MD or one of the following Advanced Practice Providers on your designated Care Team:    Murray Hodgkins, NP  Christell Faith, PA-C  Marrianne Mood, PA-C   Echocardiogram An echocardiogram is a procedure that uses painless sound waves (ultrasound) to  produce an image of the heart. Images from an echocardiogram can provide important information about:  Signs of coronary artery disease (CAD).  Aneurysm detection. An aneurysm is a weak or damaged part of an artery wall that bulges out from the normal force of blood pumping through the body.  Heart size and shape. Changes in the size or shape of the heart can be associated with certain conditions, including heart failure, aneurysm, and CAD.  Heart muscle function.  Heart valve function.  Signs of a past heart attack.  Fluid buildup around the heart.  Thickening of the heart muscle.  A tumor or infectious growth around the heart valves. Tell a health care provider about:  Any allergies you have.  All medicines you are taking, including vitamins, herbs, eye drops, creams, and over-the-counter medicines.  Any blood disorders you have.  Any surgeries you have had.  Any medical conditions you have.  Whether you are pregnant or may be pregnant. What are the risks? Generally, this is a safe procedure. However, problems may occur, including:  Allergic reaction to dye (contrast) that may be used during the procedure. What happens before the procedure? No specific preparation is needed. You may eat and drink normally. What happens during the procedure?   An IV tube may be inserted into one of your veins.  You may receive contrast through this tube. A contrast is an injection that improves the quality of the pictures from your heart.  A gel will be applied to your chest.  A wand-like tool (transducer) will be moved over your chest. The gel will help to transmit the sound waves from the transducer.  The sound waves will harmlessly bounce off of your heart to allow the heart images to be captured in real-time motion. The images will be recorded on a computer. The procedure may vary among health care providers and hospitals. What happens after the procedure?  You may return to  your normal, everyday life, including diet, activities, and medicines, unless your health care provider tells you not to do that. Summary  An echocardiogram is a procedure that uses painless sound waves (ultrasound) to produce an image of the heart.  Images from an echocardiogram can provide important information about the size and shape of your heart, heart muscle function, heart valve function, and fluid buildup around your heart.  You do not need to do anything to prepare before this procedure. You may eat and drink normally.  After the echocardiogram is completed, you may return to your normal, everyday life, unless your health care provider tells you not to do that. This information is not intended to replace advice given to you by your health care provider. Make sure you discuss any questions you have with your health care provider. Document Revised: 01/02/2019 Document Reviewed: 10/14/2016 Elsevier Patient Education  2020 Flasher, Kate Sable, MD  12/02/2019 9:34 AM    [place]

## 2019-12-02 NOTE — Progress Notes (Deleted)
Cardiology Office Note:    Date:  12/02/2019   ID:  [place], Wentzell 1961/05/20, MRN [id number]  PCP:  Burnard Hawthorne, FNP  Cardiologist:  Kate Sable, MD  Electrophysiologist:  None   Referring MD: Burnard Hawthorne, FNP   Chief Complaint  Patient presents with  . New Patient (Initial Visit)    HTN/elevated triglycerides; Meds verbally reviewed with patient.  ***  History of Present Illness:    Danielle Humphrey is a 59 y.o. female with a hx of ***  Past Medical History:  Diagnosis Date  . Chickenpox 1960  . Hypercholesteremia 2017  . Hypertension 1980  . Mitral valve prolapse 2016  . Shingles 2016    Past Surgical History:  Procedure Laterality Date  . 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 APPENDECTOMY Bilateral 1968  . BREAST EXCISIONAL BIOPSY Bilateral 2005   benign  . BREAST LUMPECTOMY Bilateral 2005    Current Medications: Current Meds  Medication Sig  . ALPRAZolam (XANAX) 0.5 MG tablet TAKE ONE TABLET BY MOUTH ONCE DAILY AS NEEDED FOR ANXIETY  . felodipine (PLENDIL) 10 MG 24 hr tablet TAKE 1 TABLET BY MOUTH EVERY DAY  . rosuvastatin (CRESTOR) 10 MG tablet Take 1 tablet (10 mg total) by mouth 3 (three) times a week.  . sertraline (ZOLOFT) 100 MG tablet TAKE 1 TABLET BY MOUTH EVERYDAY AT BEDTIME  . telmisartan (MICARDIS) 40 MG tablet TAKE 1 TABLET BY MOUTH EVERY DAY     Allergies:   Codeine and Methotrexate derivatives   Social History   Socioeconomic History  . Marital status: Married    Spouse name: Kikue Brezina  . Number of children: 2  . Years of education: 2  . Highest education level: Not on file  Occupational History  . Occupation: [employer]  Tobacco Use  . Smoking status: Never Smoker  . Smokeless tobacco: Never Used  Substance and Sexual Activity  . Alcohol use: Yes    Alcohol/week: 7.0 standard drinks    Types: 3 Glasses of wine, 4 Cans of beer per week    Comment: weekly  . Drug use: No  . Sexual activity:  Yes  Other Topics Concern  . 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 Difficulty of Paying Living Expenses: Not on file  Food Insecurity:   . Worried About Charity fundraiser in the Last Year: Not on file  . Ran Out of Food in the Last Year: Not on file  Transportation Needs:   . Lack of Transportation (Medical): Not on file  . Lack of Transportation (Non-Medical): Not on file  Physical Activity:   . Days of Exercise per Week: Not on file  . Minutes of Exercise per Session: Not on file  Stress:   . Feeling of Stress : Not on file  Social Connections:   . Frequency of Communication with Friends and Family: Not on file  . Frequency of Social Gatherings with Friends and Family: Not on file  . Attends Religious Services: Not on file  . Active Member of Clubs or Organizations: Not on file  . Attends Archivist Meetings: Not on file  . Marital Status:  Not on file     Family History: The patient's ***family history includes Breast cancer (age of onset: 43) in her mother; Dementia in her mother; Diabetes type II in her sister; Heart attack in her father; Heart disease in her brother; Hypercholesterolemia in her father; Hypertension in her father; Lupus in her mother; Stroke in her mother. There is no history of Ovarian cancer.  ROS:   Please see the history of present illness.    *** All other systems reviewed and are negative.  EKGs/Labs/Other Studies Reviewed:    The following studies were reviewed today: ***  EKG:  EKG is *** ordered today.  The ekg ordered today demonstrates ***  Recent Labs: 11/24/2019: ALT 12; BUN 16; Creatinine, Ser 0.83; Hemoglobin 13.0; Platelets 209.0; Potassium 3.7; Sodium 137; TSH 5.54  Recent Lipid Panel    Component Value Date/Time   CHOL  274 (H) 11/24/2019 0809   TRIG 343.0 (H) 11/24/2019 0809   HDL 41.90 11/24/2019 0809   CHOLHDL 7 11/24/2019 0809   VLDL 68.6 (H) 11/24/2019 0809   LDLCALC 158 (H) 07/05/2018 0859   LDLDIRECT 148.0 11/24/2019 0809    Physical Exam:    VS:  BP (!) 130/94 (BP Location: Right Arm, Patient Position: Sitting, Cuff Size: Normal)   Pulse 68   Ht 5\' 10"  (1.778 m)   Wt 192 lb 8 oz (87.3 kg)   SpO2 98%   BMI 27.62 kg/m     Wt Readings from Last 3 Encounters:  12/02/19 192 lb 8 oz (87.3 kg)  11/11/19 190 lb (86.2 kg)  11/06/18 198 lb 12.8 oz (90.2 kg)     GEN: *** Well nourished, well developed in no acute distress HEENT: Normal NECK: No JVD; No carotid bruits LYMPHATICS: No lymphadenopathy CARDIAC: ***RRR, no murmurs, rubs, gallops RESPIRATORY:  Clear to auscultation without rales, wheezing or rhonchi  ABDOMEN: Soft, non-tender, non-distended MUSCULOSKELETAL:  No edema; No deformity  SKIN: Warm and dry NEUROLOGIC:  Alert and oriented x 3 PSYCHIATRIC:  Normal affect   ASSESSMENT:    No diagnosis found. PLAN:    In order of problems listed above:  1. ***   Medication Adjustments/Labs and Tests Ordered: Current medicines are reviewed at length with the patient today.  Concerns regarding medicines are outlined above.  No orders of the defined types were placed in this encounter.  No orders of the defined types were placed in this encounter.   There are no Patient Instructions on file for this visit.   Signed, Kate Sable, MD  12/02/2019 9:16 AM    [place]

## 2019-12-05 ENCOUNTER — Other Ambulatory Visit: Payer: Self-pay

## 2019-12-05 ENCOUNTER — Ambulatory Visit: Payer: No Typology Code available for payment source | Attending: Internal Medicine

## 2019-12-05 DIAGNOSIS — Z23 Encounter for immunization: Secondary | ICD-10-CM

## 2019-12-05 NOTE — Progress Notes (Signed)
   Covid-19 Vaccination Clinic  Name:  Danielle Humphrey    MRN: [id number] DOB: Aug 24, 1961  12/05/2019  Danielle Humphrey was observed post Covid-19 immunization for 15 minutes without incident. She was provided with Vaccine Information Sheet and instruction to access the V-Safe system.   Danielle Humphrey was instructed to call 911 with any severe reactions post vaccine: Marland Kitchen Difficulty breathing  . Swelling of face and throat  . A fast heartbeat  . A bad rash all over body  . Dizziness and weakness   Immunizations Administered    Name Date Dose VIS Date Route   Pfizer COVID-19 Vaccine 12/05/2019  8:34 AM 0.3 mL 09/05/2019 Intramuscular   Manufacturer: [place]   Lot: [id number]   Delta: [id number]

## 2019-12-10 ENCOUNTER — Other Ambulatory Visit: Payer: Self-pay

## 2019-12-10 ENCOUNTER — Other Ambulatory Visit
Admission: RE | Admit: 2019-12-10 | Discharge: 2019-12-10 | Disposition: A | Discharge status: Home / Self Care | Payer: PRIVATE HEALTH INSURANCE | Source: Ambulatory Visit | Attending: Gastroenterology | Admitting: Gastroenterology

## 2019-12-10 DIAGNOSIS — Z01812 Encounter for preprocedural laboratory examination: Secondary | ICD-10-CM | POA: Insufficient documentation

## 2019-12-10 DIAGNOSIS — Z20822 Contact with and (suspected) exposure to covid-19: Secondary | ICD-10-CM | POA: Insufficient documentation

## 2019-12-11 LAB — SARS CORONAVIRUS 2 (TAT 6-24 HRS): SARS Coronavirus 2: NEGATIVE

## 2019-12-12 ENCOUNTER — Ambulatory Visit: Payer: PRIVATE HEALTH INSURANCE | Admitting: Registered Nurse

## 2019-12-12 ENCOUNTER — Ambulatory Visit
Admission: RE | Admit: 2019-12-12 | Discharge: 2019-12-12 | Disposition: A | Discharge status: Home / Self Care | Payer: PRIVATE HEALTH INSURANCE | Attending: Gastroenterology | Admitting: Gastroenterology

## 2019-12-12 ENCOUNTER — Encounter: Payer: Self-pay | Admitting: Gastroenterology

## 2019-12-12 ENCOUNTER — Encounter
Admission: RE | Disposition: A | Discharge status: Home / Self Care | Payer: Self-pay | Source: Home / Self Care | Attending: Gastroenterology

## 2019-12-12 ENCOUNTER — Other Ambulatory Visit: Payer: Self-pay

## 2019-12-12 DIAGNOSIS — E7800 Pure hypercholesterolemia, unspecified: Secondary | ICD-10-CM | POA: Diagnosis not present

## 2019-12-12 DIAGNOSIS — Z89022 Acquired absence of left finger(s): Secondary | ICD-10-CM | POA: Insufficient documentation

## 2019-12-12 DIAGNOSIS — Z8249 Family history of ischemic heart disease and other diseases of the circulatory system: Secondary | ICD-10-CM | POA: Insufficient documentation

## 2019-12-12 DIAGNOSIS — K635 Polyp of colon: Secondary | ICD-10-CM | POA: Diagnosis not present

## 2019-12-12 DIAGNOSIS — Z885 Allergy status to narcotic agent status: Secondary | ICD-10-CM | POA: Insufficient documentation

## 2019-12-12 DIAGNOSIS — Z1211 Encounter for screening for malignant neoplasm of colon: Secondary | ICD-10-CM | POA: Insufficient documentation

## 2019-12-12 DIAGNOSIS — I10 Essential (primary) hypertension: Secondary | ICD-10-CM | POA: Diagnosis not present

## 2019-12-12 DIAGNOSIS — Z79899 Other long term (current) drug therapy: Secondary | ICD-10-CM | POA: Diagnosis not present

## 2019-12-12 DIAGNOSIS — I341 Nonrheumatic mitral (valve) prolapse: Secondary | ICD-10-CM | POA: Insufficient documentation

## 2019-12-12 DIAGNOSIS — D123 Benign neoplasm of transverse colon: Secondary | ICD-10-CM | POA: Insufficient documentation

## 2019-12-12 DIAGNOSIS — Z0000 Encounter for general adult medical examination without abnormal findings: Secondary | ICD-10-CM

## 2019-12-12 DIAGNOSIS — D122 Benign neoplasm of ascending colon: Secondary | ICD-10-CM | POA: Diagnosis not present

## 2019-12-12 HISTORY — PX: COLONOSCOPY WITH PROPOFOL: SHX5780

## 2019-12-12 SURGERY — COLONOSCOPY WITH PROPOFOL
Anesthesia: General

## 2019-12-12 MED ORDER — LIDOCAINE HCL (PF) 2 % IJ SOLN
INTRAMUSCULAR | Status: AC
  Filled 2019-12-12: qty 10

## 2019-12-12 MED ORDER — PROPOFOL 500 MG/50ML IV EMUL
INTRAVENOUS | Status: AC
  Filled 2019-12-12: qty 50

## 2019-12-12 MED ORDER — SODIUM CHLORIDE 0.9 % IV SOLN
INTRAVENOUS | Status: DC
  Administered 2019-12-12: 10:00:00 1000 mL via INTRAVENOUS

## 2019-12-12 MED ORDER — PROPOFOL 10 MG/ML IV BOLUS
INTRAVENOUS | Status: DC | PRN
  Administered 2019-12-12: 80 mg via INTRAVENOUS

## 2019-12-12 MED ORDER — LIDOCAINE HCL (CARDIAC) PF 100 MG/5ML IV SOSY
PREFILLED_SYRINGE | INTRAVENOUS | Status: DC | PRN
  Administered 2019-12-12: 80 mg via INTRAVENOUS

## 2019-12-12 MED ORDER — PROPOFOL 500 MG/50ML IV EMUL
INTRAVENOUS | Status: DC | PRN
  Administered 2019-12-12: 125 ug/kg/min via INTRAVENOUS

## 2019-12-12 NOTE — H&P (Signed)
Danielle Darby, MD [address]  [place]  [place], [place] [address]  Main: [phone]  Fax: [phone] Pager: [phone]  Primary Care Physician:  Burnard Hawthorne, FNP Primary Gastroenterologist:  Dr. Cephas Humphrey  Pre-Procedure History & Physical: HPI:  Danielle Humphrey is a 59 y.o. female is here for an colonoscopy.   Past Medical History:  Diagnosis Date  . Chickenpox 1960  . Hypercholesteremia 2017  . Hypertension 1980  . Mitral valve prolapse 2016  . Shingles 2016    Past Surgical History:  Procedure Laterality Date  . 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 APPENDECTOMY Bilateral 1968  . BREAST EXCISIONAL BIOPSY Bilateral 2005   benign  . BREAST LUMPECTOMY Bilateral 2005    Prior to Admission medications   Medication Sig Start Date End Date Taking? Authorizing Provider  ALPRAZolam Duanne Moron) 0.5 MG tablet TAKE ONE TABLET BY MOUTH ONCE DAILY AS NEEDED FOR ANXIETY 09/22/19  Yes Burnard Hawthorne, FNP  felodipine (PLENDIL) 10 MG 24 hr tablet TAKE 1 TABLET BY MOUTH EVERY DAY 06/24/19  Yes Crecencio Mc, MD  rosuvastatin (CRESTOR) 10 MG tablet Take 1 tablet (10 mg total) by mouth 3 (three) times a week. 11/28/19  Yes Burnard Hawthorne, FNP  sertraline (ZOLOFT) 100 MG tablet TAKE 1 TABLET BY MOUTH EVERYDAY AT BEDTIME 01/10/19  Yes Burnard Hawthorne, FNP  telmisartan (MICARDIS) 40 MG tablet TAKE 1 TABLET BY MOUTH EVERY DAY 11/04/19  Yes Burnard Hawthorne, FNP    Allergies as of 11/19/2019 - Review Complete 11/11/2019  Allergen Reaction Noted  . Codeine Anaphylaxis 10/19/2016  . Methotrexate derivatives Anaphylaxis 10/19/2016    Family History  Problem Relation Age of Onset  . Breast cancer Mother 10       2005  . Dementia Mother   . Stroke Mother   . Lupus Mother   . Hypercholesterolemia Father   . Heart attack Father        7  . Hypertension Father   . Diabetes type II Sister   . Heart disease Brother   . Ovarian cancer Neg Hx      Social History   Socioeconomic History  . Marital status: Married    Spouse name: Kayelee Abid  . Number of children: 2  . Years of education: 2  . Highest education level: Not on file  Occupational History  . Occupation: [employer]  Tobacco Use  . Smoking status: Never Smoker  . Smokeless tobacco: Never Used  Substance and Sexual Activity  . Alcohol use: Yes    Alcohol/week: 7.0 standard drinks    Types: 3 Glasses of wine, 4 Cans of beer per week    Comment: weekly  . Drug use: No  . Sexual activity: Yes  Other Topics Concern  . Not on file  Social History Narrative   Lives in [place]      Married      2 children   Granddaughter- two of them      Work- Market researcher for [employer]   Social Determinants of Radio broadcast assistant Strain:   . Difficulty of Paying Living Expenses:   Food Insecurity:   . Worried About Charity fundraiser in the Last Year:   . Arboriculturist in the Last Year:   Transportation Needs:   . Film/video editor (Medical):   Marland Kitchen Lack of Transportation (Non-Medical):   Physical Activity:   .  Days of Exercise per Week:   . Minutes of Exercise per Session:   Stress:   . Feeling of Stress :   Social Connections:   . Frequency of Communication with Friends and Family:   . Frequency of Social Gatherings with Friends and Family:   . Attends Religious Services:   . Active Member of Clubs or Organizations:   . Attends Archivist Meetings:   Marland Kitchen Marital Status:   Intimate Partner Violence:   . Fear of Current or Ex-Partner:   . Emotionally Abused:   Marland Kitchen Physically Abused:   . Sexually Abused:     Review of Systems: See HPI, otherwise negative ROS  Physical Exam: BP (!) 147/104   Pulse 68   Temp (!) 97.2 F (36.2 C) (Temporal)   Resp 16   Ht 5\' 10"  ([id number] m)   Wt 86.4 kg   SpO2 100%   BMI 27.32 kg/m  General:   Alert,  pleasant and cooperative in NAD Head:  Normocephalic and  atraumatic. Neck:  Supple; no masses or thyromegaly. Lungs:  Clear throughout to auscultation.    Heart:  Regular rate and rhythm. Abdomen:  Soft, nontender and nondistended. Normal bowel sounds, without guarding, and without rebound.   Neurologic:  Alert and  oriented x4;  grossly normal neurologically.  Impression/Plan: Danielle Humphrey is here for an colonoscopy to be performed for colon cancer screening  Risks, benefits, limitations, and alternatives regarding  colonoscopy have been reviewed with the patient.  Questions have been answered.  All parties agreeable.   Sherri Sear, MD  12/12/2019, 10:11 AM

## 2019-12-12 NOTE — Anesthesia Postprocedure Evaluation (Signed)
Anesthesia Post Note  Patient: Danielle Humphrey  Procedure(s) Performed: COLONOSCOPY WITH PROPOFOL (N/A )  Patient location during evaluation: Endoscopy Anesthesia Type: General Level of consciousness: awake and alert and oriented Pain management: pain level controlled Vital Signs Assessment: post-procedure vital signs reviewed and stable Respiratory status: spontaneous breathing Cardiovascular status: blood pressure returned to baseline Anesthetic complications: no     Last Vitals:  Vitals:   12/12/19 1147 12/12/19 1153  BP: (!) 153/88 (!) 153/98  Pulse: 61 (!) 51  Resp: 17 15  Temp:    SpO2: 100% 95%    Last Pain:  Vitals:   12/12/19 1153  TempSrc:   PainSc: 0-No pain                 Angelo Caroll

## 2019-12-12 NOTE — Op Note (Signed)
[facility] Gastroenterology Patient Name: Danielle Humphrey Procedure Date: 12/12/2019 10:54 AM MRN: [id number] Account #: [id number] Date of Birth: 07/24/1961 Admit Type: Outpatient Age: 59 Room: [facility] ENDO ROOM 3 Gender: Female Note Status: Finalized Procedure:             Colonoscopy Indications:           Screening for colorectal malignant neoplasm Providers:             Lin Landsman MD, MD Medicines:             Monitored Anesthesia Care Complications:         No immediate complications. Estimated blood loss: None. Procedure:             Pre-Anesthesia Assessment:                        - Prior to the procedure, a History and Physical was                         performed, and patient medications and allergies were                         reviewed. The patient is competent. The risks and                         benefits of the procedure and the sedation options and                         risks were discussed with the patient. All questions                         were answered and informed consent was obtained.                         Patient identification and proposed procedure were                         verified by the physician, the nurse, the                         anesthesiologist, the anesthetist and the technician                         in the pre-procedure area in the procedure room in the                         endoscopy suite. Mental Status Examination: alert and                         oriented. Airway Examination: normal oropharyngeal                         airway and neck mobility. Respiratory Examination:                         clear to auscultation. CV Examination: normal.                         Prophylactic Antibiotics: The patient does not  require                         prophylactic antibiotics. Prior Anticoagulants: The                         patient has taken no previous anticoagulant or                         antiplatelet agents.  ASA Grade Assessment: II - A                         patient with mild systemic disease. After reviewing                         the risks and benefits, the patient was deemed in                         satisfactory condition to undergo the procedure. The                         anesthesia plan was to use monitored anesthesia care                         (MAC). Immediately prior to administration of                         medications, the patient was re-assessed for adequacy                         to receive sedatives. The heart rate, respiratory                         rate, oxygen saturations, blood pressure, adequacy of                         pulmonary ventilation, and response to care were                         monitored throughout the procedure. The physical                         status of the patient was re-assessed after the                         procedure.                        After obtaining informed consent, the colonoscope was                         passed under direct vision. Throughout the procedure,                         the patient's blood pressure, pulse, and oxygen                         saturations were monitored continuously. The  Colonoscope was introduced through the anus and                         advanced to the the cecum, identified by appendiceal                         orifice and ileocecal valve. The colonoscopy was                         performed without difficulty. The patient tolerated                         the procedure well. The quality of the bowel                         preparation was evaluated using the BBPS [facility] Bowel                         Preparation Scale) with scores of: Right Colon = 3,                         Transverse Colon = 3 and Left Colon = 3 (entire mucosa                         seen well with no residual staining, small fragments                         of stool or opaque liquid). The total  BBPS score                         equals 9. Findings:      The perianal and digital rectal examinations were normal. Pertinent       negatives include normal sphincter tone and no palpable rectal lesions.      Two sessile polyps were found in the transverse colon and ascending       colon. The polyps were 3 to 4 mm in size. These polyps were removed with       a cold snare. Resection and retrieval were complete.      The retroflexed view of the distal rectum and anal verge was normal and       showed no anal or rectal abnormalities. Impression:            - Two 3 to 4 mm polyps in the transverse colon and in                         the ascending colon, removed with a cold snare.                         Resected and retrieved.                        - The distal rectum and anal verge are normal on                         retroflexion view. Recommendation:        - Discharge patient to home (with escort).                        -  Resume previous diet today.                        - Continue present medications.                        - Await pathology results.                        - Repeat colonoscopy in 7 years for surveillance. Procedure Code(s):     --- Professional ---                        [phone], Colonoscopy, flexible; with removal of                         tumor(s), polyp(s), or other lesion(s) by snare                         technique Diagnosis Code(s):     --- Professional ---                        Z12.11, Encounter for screening for malignant neoplasm                         of colon                        K63.5, Polyp of colon CPT copyright 2019 American Medical Association. All rights reserved. The codes documented in this report are preliminary and upon coder review may  be revised to meet current compliance requirements. Dr. Ulyess Mort Lin Landsman MD, MD 12/12/2019 11:21:18 AM This report has been signed electronically. Number of Addenda: 0 Note Initiated  On: 12/12/2019 10:54 AM Scope Withdrawal Time: 0 hours 13 minutes 37 seconds  Total Procedure Duration: 0 hours 17 minutes 41 seconds  Estimated Blood Loss:  Estimated blood loss: none.      [facility]

## 2019-12-12 NOTE — Transfer of Care (Signed)
Immediate Anesthesia Transfer of Care Note  Patient: Danielle Humphrey  Procedure(s) Performed: COLONOSCOPY WITH PROPOFOL (N/A )  Patient Location: Endoscopy Unit  Anesthesia Type:General  Level of Consciousness: drowsy  Airway & Oxygen Therapy: Patient Spontanous Breathing  Post-op Assessment: Report given to RN and Post -op Vital signs reviewed and stable  Post vital signs: Reviewed and stable  Last Vitals:  Vitals Value Taken Time  BP 121/74 12/12/19 1123  Temp    Pulse 74 12/12/19 1123  Resp 16 12/12/19 1123  SpO2 96 % 12/12/19 1123  Vitals shown include unvalidated device data.  Last Pain:  Vitals:   12/12/19 1123  TempSrc: (P) Temporal  PainSc:          Complications: No apparent anesthesia complications

## 2019-12-12 NOTE — Anesthesia Preprocedure Evaluation (Signed)
Anesthesia Evaluation  Patient identified by MRN, date of birth, ID band Patient awake    Reviewed: Allergy & Precautions, NPO status , Patient's Chart, lab work & pertinent test results  Airway Mallampati: III       Dental   Pulmonary neg pulmonary ROS,    Pulmonary exam normal        Cardiovascular hypertension, Normal cardiovascular exam+ Valvular Problems/Murmurs MVP      Neuro/Psych PSYCHIATRIC DISORDERS Anxiety Depression negative neurological ROS     GI/Hepatic Neg liver ROS,   Endo/Other  negative endocrine ROS  Renal/GU negative Renal ROS  negative genitourinary   Musculoskeletal negative musculoskeletal ROS (+)   Abdominal Normal abdominal exam  (+)   Peds negative pediatric ROS (+)  Hematology negative hematology ROS (+)   Anesthesia Other Findings   Reproductive/Obstetrics                             Anesthesia Physical Anesthesia Plan  ASA: II  Anesthesia Plan: General   Post-op Pain Management:    Induction: Intravenous  PONV Risk Score and Plan: Propofol infusion  Airway Management Planned: Nasal Cannula  Additional Equipment:   Intra-op Plan:   Post-operative Plan:   Informed Consent: I have reviewed the patients History and Physical, chart, labs and discussed the procedure including the risks, benefits and alternatives for the proposed anesthesia with the patient or authorized representative who has indicated his/her understanding and acceptance.     Dental advisory given  Plan Discussed with: CRNA and Surgeon  Anesthesia Plan Comments:         Anesthesia Quick Evaluation

## 2019-12-15 ENCOUNTER — Encounter: Payer: Self-pay | Admitting: Gastroenterology

## 2019-12-15 ENCOUNTER — Encounter: Payer: Self-pay | Admitting: *Deleted

## 2019-12-15 LAB — SURGICAL PATHOLOGY

## 2019-12-16 ENCOUNTER — Other Ambulatory Visit: Payer: Self-pay

## 2019-12-16 ENCOUNTER — Ambulatory Visit (INDEPENDENT_AMBULATORY_CARE_PROVIDER_SITE_OTHER): Payer: No Typology Code available for payment source

## 2019-12-16 DIAGNOSIS — I340 Nonrheumatic mitral (valve) insufficiency: Secondary | ICD-10-CM | POA: Diagnosis not present

## 2019-12-25 ENCOUNTER — Other Ambulatory Visit: Payer: Self-pay | Admitting: Internal Medicine

## 2019-12-25 DIAGNOSIS — I10 Essential (primary) hypertension: Secondary | ICD-10-CM

## 2019-12-30 ENCOUNTER — Ambulatory Visit: Payer: No Typology Code available for payment source | Admitting: Cardiology

## 2019-12-30 ENCOUNTER — Ambulatory Visit: Payer: No Typology Code available for payment source | Attending: Internal Medicine

## 2019-12-30 DIAGNOSIS — Z23 Encounter for immunization: Secondary | ICD-10-CM

## 2019-12-30 NOTE — Progress Notes (Signed)
   Covid-19 Vaccination Clinic  Name:  Danielle Humphrey    MRN: [id number] DOB: 06-Dec-1960  12/30/2019  Danielle Humphrey was observed post Covid-19 immunization for 15 minutes without incident. She was provided with Vaccine Information Sheet and instruction to access the V-Safe system.   Danielle Humphrey was instructed to call 911 with any severe reactions post vaccine: Marland Kitchen Difficulty breathing  . Swelling of face and throat  . A fast heartbeat  . A bad rash all over body  . Dizziness and weakness   Immunizations Administered    Name Date Dose VIS Date Route   Pfizer COVID-19 Vaccine 12/30/2019  8:54 AM 0.3 mL 09/05/2019 Intramuscular   Manufacturer: [place]   Lot: [id number]   [place]: [id number]

## 2020-01-01 ENCOUNTER — Encounter: Payer: Self-pay | Admitting: Cardiology

## 2020-01-01 ENCOUNTER — Ambulatory Visit (INDEPENDENT_AMBULATORY_CARE_PROVIDER_SITE_OTHER): Payer: No Typology Code available for payment source | Admitting: Cardiology

## 2020-01-01 ENCOUNTER — Other Ambulatory Visit: Payer: Self-pay

## 2020-01-01 VITALS — BP 120/70 | HR 58 | Ht 70.0 in | Wt 189.1 lb

## 2020-01-01 DIAGNOSIS — I10 Essential (primary) hypertension: Secondary | ICD-10-CM

## 2020-01-01 DIAGNOSIS — I340 Nonrheumatic mitral (valve) insufficiency: Secondary | ICD-10-CM | POA: Diagnosis not present

## 2020-01-01 DIAGNOSIS — E7800 Pure hypercholesterolemia, unspecified: Secondary | ICD-10-CM

## 2020-01-01 NOTE — Patient Instructions (Signed)
Medication Instructions:  No changes  *If you need a refill on your cardiac medications before your next appointment, please call your pharmacy*   Lab Work: None  If you have labs (blood work) drawn today and your tests are completely normal, you will receive your results only by: Marland Kitchen MyChart Message (if you have MyChart) OR . A paper copy in the mail If you have any lab test that is abnormal or we need to change your treatment, we will call you to review the results.   Testing/Procedures: Echo in one year Your physician has requested that you have an echocardiogram. Echocardiography is a painless test that uses sound waves to create images of your heart. It provides your doctor with information about the size and shape of your heart and how well your heart's chambers and valves are working. This procedure takes approximately one hour. There are no restrictions for this procedure.     Follow-Up: At [facility], you and your health needs are our priority.  As part of our continuing mission to provide you with exceptional heart care, we have created designated Provider Care Teams.  These Care Teams include your primary Cardiologist (physician) and Advanced Practice Providers (APPs -  Physician Assistants and Nurse Practitioners) who all work together to provide you with the care you need, when you need it.  Your next appointment:   Follow up in one year after echo has been done.   The format for your next appointment:   In Person  Provider:    You may see Kate Sable, MD or one of the following Advanced Practice Providers on your designated Care Team:    Murray Hodgkins, NP  Christell Faith, PA-C  Marrianne Mood, PA-C

## 2020-01-01 NOTE — Progress Notes (Signed)
Cardiology Office Note:    Date:  01/01/2020   ID:  Danielle, Humphrey January 20, 1961, MRN [id number]  PCP:  Burnard Hawthorne, FNP  Cardiologist:  Kate Sable, MD  Electrophysiologist:  None   Referring MD: Burnard Hawthorne, FNP   Chief Complaint  Patient presents with  . other    4 week follow up and discuss Echo 12/16/2019. "doing well."     History of Present Illness:    Danielle Humphrey is a 59 y.o. female with a hx of mild MR, hypertension, hyperlipidemia who presents for follow-up.  She was last seen due to abnormal lipid levels and history of MR.  She states being intolerant to statin and PCP started her on Crestor 10 3 times.  With plan to slowly titrate as patient tolerates.  Due to prior history of MR, echocardiogram was ordered.  She presents for follow-up.  She has no complaints or concerns at this time.   Past Medical History:  Diagnosis Date  . Chickenpox 1960  . Hypercholesteremia 2017  . Hypertension 1980  . Mitral valve prolapse 2016  . Shingles 2016    Past Surgical History:  Procedure Laterality Date  . 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 APPENDECTOMY Bilateral 1968  . BREAST EXCISIONAL BIOPSY Bilateral 2005   benign  . BREAST LUMPECTOMY Bilateral 2005  . COLONOSCOPY WITH PROPOFOL N/A 12/12/2019   Procedure: COLONOSCOPY WITH PROPOFOL;  Surgeon: Lin Landsman, MD;  Location[facility] ENDOSCOPY;  Service: Gastroenterology;  Laterality: N/A;    Current Medications: Current Meds  Medication Sig  . ALPRAZolam (XANAX) 0.5 MG tablet TAKE ONE TABLET BY MOUTH ONCE DAILY AS NEEDED FOR ANXIETY  . Cholecalciferol (VITAMIN D3) 20 MCG (800 UNIT) TABS Take 800 Units by mouth daily.  . felodipine (PLENDIL) 10 MG 24 hr tablet TAKE 1 TABLET BY MOUTH EVERY DAY  . rosuvastatin (CRESTOR) 10 MG tablet Take 1 tablet (10 mg total) by mouth 3 (three) times a week.  . sertraline (ZOLOFT) 100 MG tablet TAKE 1 TABLET BY MOUTH EVERYDAY AT BEDTIME  . telmisartan  (MICARDIS) 40 MG tablet TAKE 1 TABLET BY MOUTH EVERY DAY     Allergies:   Codeine and Methotrexate derivatives   Social History   Socioeconomic History  . Marital status: Married    Spouse name: Amilliana Pasche  . Number of children: 2  . Years of education: 2  . Highest education level: Not on file  Occupational History  . Occupation: [employer]  Tobacco Use  . Smoking status: Never Smoker  . Smokeless tobacco: Never Used  Substance and Sexual Activity  . Alcohol use: Yes    Alcohol/week: 7.0 standard drinks    Types: 3 Glasses of wine, 4 Cans of beer per week    Comment: weekly  . Drug use: No  . Sexual activity: Yes  Other Topics Concern  . Not on file  Social History Narrative   Lives in [place]      Married      2 children   Granddaughter- two of them      Work- Market researcher for Paper distribution   Social Determinants of Radio broadcast assistant Strain:   . Difficulty of Paying Living Expenses:   Food Insecurity:   . Worried About Charity fundraiser in the Last Year:   . [place] in the Last Year:   Transportation Needs:   . Lack of  Transportation (Medical):   Marland Kitchen Lack of Transportation (Non-Medical):   Physical Activity:   . Days of Exercise per Week:   . Minutes of Exercise per Session:   Stress:   . Feeling of Stress :   Social Connections:   . Frequency of Communication with Friends and Family:   . Frequency of Social Gatherings with Friends and Family:   . Attends Religious Services:   . Active Member of Clubs or Organizations:   . Attends Archivist Meetings:   Marland Kitchen Marital Status:      Family History: The patient's family history includes Breast cancer (age of onset: 44) in her mother; Dementia in her mother; Diabetes type II in her sister; Heart attack in her father; Heart disease in her brother; Hypercholesterolemia in her father; Hypertension in her father; Lupus in her mother; Stroke in her mother. There is no  history of Ovarian cancer.  ROS:   Please see the history of present illness.     All other systems reviewed and are negative.  EKGs/Labs/Other Studies Reviewed:    The following studies were reviewed today:   EKG:  EKG is  ordered today.  The ekg ordered today demonstrates sinus bradycardia otherwise normal EKG I.  Recent Labs: 11/24/2019: ALT 12; BUN 16; Creatinine, Ser 0.83; Hemoglobin 13.0; Platelets 209.0; Potassium 3.7; Sodium 137; TSH 5.54  Recent Lipid Panel    Component Value Date/Time   CHOL 274 (H) 11/24/2019 0809   TRIG 343.0 (H) 11/24/2019 0809   HDL 41.90 11/24/2019 0809   CHOLHDL 7 11/24/2019 0809   VLDL 68.6 (H) 11/24/2019 0809   LDLCALC 158 (H) 07/05/2018 0859   LDLDIRECT 148.0 11/24/2019 0809    Physical Exam:    VS:  BP 120/70 (BP Location: Left Arm, Patient Position: Sitting, Cuff Size: Normal)   Pulse (!) 58   Ht 5\' 10"  (1.778 m)   Wt 189 lb 2 oz (85.8 kg)   SpO2 98%   BMI 27.14 kg/m     Wt Readings from Last 3 Encounters:  01/01/20 189 lb 2 oz (85.8 kg)  12/12/19 190 lb 6.2 oz (86.4 kg)  12/02/19 192 lb 8 oz (87.3 kg)     GEN:  Well nourished, well developed in no acute distress HEENT: Normal NECK: No JVD; No carotid bruits LYMPHATICS: No lymphadenopathy CARDIAC: RRR, no murmurs, rubs, gallops RESPIRATORY:  Clear to auscultation without rales, wheezing or rhonchi  ABDOMEN: Soft, non-tender, non-distended MUSCULOSKELETAL:  No edema; No deformity  SKIN: Warm and dry NEUROLOGIC:  Alert and oriented x 3 PSYCHIATRIC:  Normal affect   ASSESSMENT:    1. Mitral valve insufficiency, unspecified etiology   2. Pure hypercholesterolemia   3. Essential hypertension    PLAN:    In order of problems listed above:  1. Patient with history of mild mitral valve regurgitation.  Echocardiogram on 12/16/2019 showed mild to moderate MR.  EF normal at 60 to 65%.  Plan for repeat echocardiogram in about a year to 18 months 2. Patient has history of  hyperlipidemia.  10-year ASCVD risk score 7.1.  High triglycerides especially elevated.  She has a history of statin intolerance.  Continue Crestor as prescribed.  Plans for follow-up lipids scheduled with primary care provider next month. 3. Blood pressure  controlled.  Continue current BP meds.  Follow-up in 1 year after echocardiogram for mitral regurgitation follow-up  This note was generated in part or whole with voice recognition software. Voice recognition is usually quite accurate  but there are transcription errors that can and very often do occur. I apologize for any typographical errors that were not detected and corrected.  Medication Adjustments/Labs and Tests Ordered: Current medicines are reviewed at length with the patient today.  Concerns regarding medicines are outlined above.  Orders Placed This Encounter  Procedures  . EKG 12-Lead  . ECHOCARDIOGRAM COMPLETE   No orders of the defined types were placed in this encounter.   Patient Instructions  Medication Instructions:  No changes  *If you need a refill on your cardiac medications before your next appointment, please call your pharmacy*   Lab Work: None  If you have labs (blood work) drawn today and your tests are completely normal, you will receive your results only by: Marland Kitchen MyChart Message (if you have MyChart) OR . A paper copy in the mail If you have any lab test that is abnormal or we need to change your treatment, we will call you to review the results.   Testing/Procedures: Echo in one year Your physician has requested that you have an echocardiogram. Echocardiography is a painless test that uses sound waves to create images of your heart. It provides your doctor with information about the size and shape of your heart and how well your heart's chambers and valves are working. This procedure takes approximately one hour. There are no restrictions for this procedure.     Follow-Up: At [facility], you  and your health needs are our priority.  As part of our continuing mission to provide you with exceptional heart care, we have created designated Provider Care Teams.  These Care Teams include your primary Cardiologist (physician) and Advanced Practice Providers (APPs -  Physician Assistants and Nurse Practitioners) who all work together to provide you with the care you need, when you need it.  Your next appointment:   Follow up in one year after echo has been done.   The format for your next appointment:   In Person  Provider:    You may see Kate Sable, MD or one of the following Advanced Practice Providers on your designated Care Team:    Murray Hodgkins, NP  Christell Faith, PA-C  Marrianne Mood, PA-C         Signed, Kate Sable, MD  01/01/2020 9:37 AM    [place]

## 2020-02-01 ENCOUNTER — Other Ambulatory Visit: Payer: Self-pay | Admitting: Family

## 2020-02-01 DIAGNOSIS — F322 Major depressive disorder, single episode, severe without psychotic features: Secondary | ICD-10-CM

## 2020-02-09 ENCOUNTER — Other Ambulatory Visit: Payer: Self-pay

## 2020-02-09 ENCOUNTER — Other Ambulatory Visit (HOSPITAL_COMMUNITY)
Admission: RE | Admit: 2020-02-09 | Discharge: 2020-02-09 | Disposition: A | Discharge status: Home / Self Care | Payer: No Typology Code available for payment source | Source: Ambulatory Visit | Attending: Family | Admitting: Family

## 2020-02-09 ENCOUNTER — Encounter: Payer: Self-pay | Admitting: Family

## 2020-02-09 ENCOUNTER — Ambulatory Visit (INDEPENDENT_AMBULATORY_CARE_PROVIDER_SITE_OTHER): Payer: No Typology Code available for payment source | Admitting: Family

## 2020-02-09 VITALS — BP 120/78 | HR 72 | Temp 97.0°F | Ht 69.0 in | Wt 187.4 lb

## 2020-02-09 DIAGNOSIS — I10 Essential (primary) hypertension: Secondary | ICD-10-CM

## 2020-02-09 DIAGNOSIS — Z0000 Encounter for general adult medical examination without abnormal findings: Secondary | ICD-10-CM | POA: Insufficient documentation

## 2020-02-09 DIAGNOSIS — F411 Generalized anxiety disorder: Secondary | ICD-10-CM

## 2020-02-09 NOTE — Progress Notes (Signed)
Subjective:    Patient ID: Danielle Humphrey, female    DOB: 1961/01/12, 59 y.o.   MRN: [id number]  CC: Danielle Humphrey is a 59 y.o. female who presents today for physical exam.    HPI: Doing well today No complaints.   Has been sleeping well on zoloft. Uses xanax 3-4 per month, usually half to quarter of tablet.   HTN- compliant with medications.  Denies exertional chest pain or pressure, numbness or tingling radiating to left arm or jaw, palpitations, dizziness, frequent headaches, changes in vision, or shortness of breath.      Mitral valve insufficiency - Dr Charlestine Night. 01/01/2020 Colorectal Cancer Screening: UTD Dr Marius Ditch, repeat in 7 years Breast Cancer Screening: Mammogram UTD Cervical Cancer [place] screening/DEXA for 65+:deferred 65 years.  Lung Cancer Screening: Doesn't have 30 year pack year history and age > 31 years.       Tetanus - utd  Labs: Screening labs today. Exercise: Gets regular exercise.  Alcohol use: bourbon and diet ginger ale, red wine 3 x per week Smoking/tobacco use: never smoker.   HISTORY:  Past Medical History:  Diagnosis Date  . Chickenpox 1960  . Hypercholesteremia 2017  . Hypertension 1980  . Mitral valve prolapse 2016  . Shingles 2016    Past Surgical History:  Procedure Laterality Date  . 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 APPENDECTOMY Bilateral 1968  . BREAST EXCISIONAL BIOPSY Bilateral 2005   benign  . BREAST LUMPECTOMY Bilateral 2005  . COLONOSCOPY WITH PROPOFOL N/A 12/12/2019   Procedure: COLONOSCOPY WITH PROPOFOL;  Surgeon: Lin Landsman, MD;  Location: [facility] ENDOSCOPY;  Service: Gastroenterology;  Laterality: N/A;   Family History  Problem Relation Age of Onset  . Breast cancer Mother 4       2005  . Dementia Mother   . Stroke Mother   . Lupus Mother   . Hypercholesterolemia Father   . Heart attack Father        7  . Hypertension Father   . Diabetes type II Sister   . Heart disease  Brother   . Ovarian cancer Neg Hx       ALLERGIES: Codeine and Methotrexate derivatives  Current Outpatient Medications on File Prior to Visit  Medication Sig Dispense Refill  . ALPRAZolam (XANAX) 0.5 MG tablet TAKE ONE TABLET BY MOUTH ONCE DAILY AS NEEDED FOR ANXIETY 30 tablet 1  . Cholecalciferol (VITAMIN D3) 20 MCG (800 UNIT) TABS Take 800 Units by mouth daily.    . felodipine (PLENDIL) 10 MG 24 hr tablet TAKE 1 TABLET BY MOUTH EVERY DAY 30 tablet 5  . rosuvastatin (CRESTOR) 10 MG tablet Take 1 tablet (10 mg total) by mouth 3 (three) times a week. 36 tablet 3  . sertraline (ZOLOFT) 100 MG tablet TAKE 1 TABLET BY MOUTH EVERYDAY AT BEDTIME 30 tablet 14  . telmisartan (MICARDIS) 40 MG tablet TAKE 1 TABLET BY MOUTH EVERY DAY 30 tablet 5   No current facility-administered medications on file prior to visit.    Social History   Tobacco Use  . Smoking status: Never Smoker  . Smokeless tobacco: Never Used  Substance Use Topics  . Alcohol use: Yes    Alcohol/week: 7.0 standard drinks    Types: 3 Glasses of wine, 4 Cans of beer per week    Comment: bourbon and diet ginger ale, red wine 3 x per week.  . Drug use: No    Review of  Systems  Constitutional: Negative for chills, fever and unexpected weight change.  HENT: Negative for congestion.   Respiratory: Negative for cough.   Cardiovascular: Negative for chest pain, palpitations and leg swelling.  Gastrointestinal: Negative for nausea and vomiting.  Genitourinary: Negative for pelvic pain and vaginal bleeding.  Musculoskeletal: Negative for arthralgias and myalgias.  Skin: Negative for rash.  Neurological: Negative for headaches.  Hematological: Negative for adenopathy.  Psychiatric/Behavioral: Negative for confusion, sleep disturbance and suicidal ideas. The patient is nervous/anxious.       Objective:    BP 120/78   Pulse 72   Temp (!) 97 F (36.1 C) (Temporal)   Ht 5\' 9"  (1.753 m)   Wt 187 lb 6.4 oz (85 kg)   SpO2  97%   BMI 27.67 kg/m   BP Readings from Last 3 Encounters:  02/09/20 120/78  01/01/20 120/70  12/12/19 (!) 153/98   Wt Readings from Last 3 Encounters:  02/09/20 187 lb 6.4 oz (85 kg)  01/01/20 189 lb 2 oz (85.8 kg)  12/12/19 190 lb 6.2 oz (86.4 kg)    Physical Exam Vitals reviewed.  Constitutional:      Appearance: She is well-developed.  Eyes:     Conjunctiva/sclera: Conjunctivae normal.  Neck:     Thyroid: No thyroid mass or thyromegaly.  Cardiovascular:     Rate and Rhythm: Normal rate and regular rhythm.     Pulses: Normal pulses.     Heart sounds: Normal heart sounds.  Pulmonary:     Effort: Pulmonary effort is normal.     Breath sounds: Normal breath sounds. No wheezing, rhonchi or rales.  Chest:     Breasts: Breasts are symmetrical.        Right: No inverted nipple, mass, nipple discharge, skin change or tenderness.        Left: No inverted nipple, mass, nipple discharge, skin change or tenderness.  Genitourinary:    Cervix: No cervical motion tenderness, discharge or friability.     Uterus: Not enlarged, not fixed and not tender.      Adnexa:        Right: No mass, tenderness or fullness.         Left: No mass, tenderness or fullness.       Comments: Pap performed. No CMT. Unable to appreciated ovaries. Lymphadenopathy:     Head:     Right side of head: No submental, submandibular, tonsillar, preauricular, posterior auricular or occipital adenopathy.     Left side of head: No submental, submandibular, tonsillar, preauricular, posterior auricular or occipital adenopathy.     Cervical:     Right cervical: No superficial, deep or posterior cervical adenopathy.    Left cervical: No superficial, deep or posterior cervical adenopathy.     Upper Body:     Right upper body: No pectoral adenopathy.     Left upper body: No pectoral adenopathy.  Skin:    General: Skin is warm and dry.  Neurological:     Mental Status: She is alert.  Psychiatric:        Speech:  Speech normal.        Behavior: Behavior normal.        Thought Content: Thought content normal.        Assessment & Plan:   Problem List Items Addressed This Visit      Cardiovascular and Mediastinum   HTN (hypertension)    At goal, continue current regimen        Other  Generalized anxiety disorder    Well-controlled with as needed use of Xanax.  Patient continues to use sparingly.  Compliant with Zoloft . she understands not to use Xanax with alcohol.  We will continue      Routine physical examination - Primary   Relevant Orders   VITAMIN D 25 Hydroxy (Vit-D Deficiency, Fractures)   Lipid panel   CBC with Differential/Platelet   Cytology - PAP( [facility])       I am having Danielle Humphrey maintain her ALPRAZolam, telmisartan, rosuvastatin, felodipine, Vitamin D3, and sertraline.   No orders of the defined types were placed in this encounter.   Return precautions given.   Risks, benefits, and alternatives of the medications and treatment plan prescribed today were discussed, and patient expressed understanding.   Education regarding symptom management and diagnosis given to patient on AVS.   Continue to follow with Burnard Hawthorne, FNP for routine health maintenance.   Danielle Humphrey and I agreed with plan.   Mable Paris, FNP

## 2020-02-09 NOTE — Assessment & Plan Note (Signed)
At goal, continue current regimen 

## 2020-02-09 NOTE — Assessment & Plan Note (Signed)
Well-controlled with as needed use of Xanax.  Patient continues to use sparingly.  Compliant with Zoloft . she understands not to use Xanax with alcohol.  We will continue

## 2020-02-09 NOTE — Assessment & Plan Note (Signed)
Clinical breast exam performed today and also Pap smear.  Mammogram is up-to-date.  Encouraged exercise

## 2020-02-09 NOTE — Patient Instructions (Signed)
Nice to see you!   Health Maintenance for Postmenopausal Women Menopause is a normal process in which your ability to get pregnant comes to an end. This process happens slowly over many months or years, usually between the ages of 59 and 43. Menopause is complete when you have missed your menstrual periods for 12 months. It is important to talk with your health care provider about some of the most common conditions that affect women after menopause (postmenopausal women). These include heart disease, cancer, and bone loss (osteoporosis). Adopting a healthy lifestyle and getting preventive care can help to promote your health and wellness. The actions you take can also lower your chances of developing some of these common conditions. What should I know about menopause? During menopause, you may get a number of symptoms, such as:  Hot flashes. These can be moderate or severe.  Night sweats.  Decrease in sex drive.  Mood swings.  Headaches.  Tiredness.  Irritability.  Memory problems.  Insomnia. Choosing to treat or not to treat these symptoms is a decision that you make with your health care provider. Do I need hormone replacement therapy?  Hormone replacement therapy is effective in treating symptoms that are caused by menopause, such as hot flashes and night sweats.  Hormone replacement carries certain risks, especially as you become older. If you are thinking about using estrogen or estrogen with progestin, discuss the benefits and risks with your health care provider. What is my risk for heart disease and stroke? The risk of heart disease, heart attack, and stroke increases as you age. One of the causes may be a change in the body's hormones during menopause. This can affect how your body uses dietary fats, triglycerides, and cholesterol. Heart attack and stroke are medical emergencies. There are many things that you can do to help prevent heart disease and stroke. Watch your  blood pressure  High blood pressure causes heart disease and increases the risk of stroke. This is more likely to develop in people who have high blood pressure readings, are of African descent, or are overweight.  Have your blood pressure checked: ? Every 3-5 years if you are 81-68 years of age. ? Every year if you are 64 years old or older. Eat a healthy diet   Eat a diet that includes plenty of vegetables, fruits, low-fat dairy products, and lean protein.  Do not eat a lot of foods that are high in solid fats, added sugars, or sodium. Get regular exercise Get regular exercise. This is one of the most important things you can do for your health. Most adults should:  Try to exercise for at least 150 minutes each week. The exercise should increase your heart rate and make you sweat (moderate-intensity exercise).  Try to do strengthening exercises at least twice each week. Do these in addition to the moderate-intensity exercise.  Spend less time sitting. Even light physical activity can be beneficial. Other tips  Work with your health care provider to achieve or maintain a healthy weight.  Do not use any products that contain nicotine or tobacco, such as cigarettes, e-cigarettes, and chewing tobacco. If you need help quitting, ask your health care provider.  Know your numbers. Ask your health care provider to check your cholesterol and your blood sugar (glucose). Continue to have your blood tested as directed by your health care provider. Do I need screening for cancer? Depending on your health history and family history, you may need to have cancer screening at  different stages of your life. This may include screening for:  Breast cancer.  Cervical cancer.  Lung cancer.  Colorectal cancer. What is my risk for osteoporosis? After menopause, you may be at increased risk for osteoporosis. Osteoporosis is a condition in which bone destruction happens more quickly than new bone  creation. To help prevent osteoporosis or the bone fractures that can happen because of osteoporosis, you may take the following actions:  If you are 41-13 years old, get at least 1,000 mg of calcium and at least 600 mg of vitamin D per day.  If you are older than age 16 but younger than age 67, get at least 1,200 mg of calcium and at least 600 mg of vitamin D per day.  If you are older than age 19, get at least 1,200 mg of calcium and at least 800 mg of vitamin D per day. Smoking and drinking excessive alcohol increase the risk of osteoporosis. Eat foods that are rich in calcium and vitamin D, and do weight-bearing exercises several times each week as directed by your health care provider. How does menopause affect my mental health? Depression may occur at any age, but it is more common as you become older. Common symptoms of depression include:  Low or sad mood.  Changes in sleep patterns.  Changes in appetite or eating patterns.  Feeling an overall lack of motivation or enjoyment of activities that you previously enjoyed.  Frequent crying spells. Talk with your health care provider if you think that you are experiencing depression. General instructions See your health care provider for regular wellness exams and vaccines. This may include:  Scheduling regular health, dental, and eye exams.  Getting and maintaining your vaccines. These include: ? Influenza vaccine. Get this vaccine each year before the flu season begins. ? Pneumonia vaccine. ? Shingles vaccine. ? Tetanus, diphtheria, and pertussis (Tdap) booster vaccine. Your health care provider may also recommend other immunizations. Tell your health care provider if you have ever been abused or do not feel safe at home. Summary  Menopause is a normal process in which your ability to get pregnant comes to an end.  This condition causes hot flashes, night sweats, decreased interest in sex, mood swings, headaches, or lack of  sleep.  Treatment for this condition may include hormone replacement therapy.  Take actions to keep yourself healthy, including exercising regularly, eating a healthy diet, watching your weight, and checking your blood pressure and blood sugar levels.  Get screened for cancer and depression. Make sure that you are up to date with all your vaccines. This information is not intended to replace advice given to you by your health care provider. Make sure you discuss any questions you have with your health care provider. Document Revised: 09/04/2018 Document Reviewed: 09/04/2018 Elsevier Patient Education  2020 [employer].

## 2020-02-10 LAB — CYTOLOGY - PAP
Comment: NEGATIVE
Diagnosis: NEGATIVE
High risk HPV: NEGATIVE

## 2020-03-01 ENCOUNTER — Telehealth: Payer: Self-pay | Admitting: Family

## 2020-03-01 DIAGNOSIS — R7989 Other specified abnormal findings of blood chemistry: Secondary | ICD-10-CM

## 2020-03-01 NOTE — Telephone Encounter (Signed)
Call pt She never had repeat thyroid studies I have ordered Please sch

## 2020-03-03 NOTE — Telephone Encounter (Signed)
Left a message to call back.

## 2020-03-03 NOTE — Telephone Encounter (Signed)
Patient verbalized understanding and had no further questions. Scheduled labs for 03/05/2020

## 2020-03-05 ENCOUNTER — Other Ambulatory Visit: Payer: Self-pay

## 2020-03-05 ENCOUNTER — Other Ambulatory Visit (INDEPENDENT_AMBULATORY_CARE_PROVIDER_SITE_OTHER): Payer: No Typology Code available for payment source

## 2020-03-05 DIAGNOSIS — R7989 Other specified abnormal findings of blood chemistry: Secondary | ICD-10-CM | POA: Diagnosis not present

## 2020-03-05 LAB — T3, FREE: T3, Free: 3.3 pg/mL (ref 2.3–4.2)

## 2020-03-05 LAB — T4, FREE: Free T4: 0.69 ng/dL (ref 0.60–1.60)

## 2020-03-05 LAB — TSH: TSH: 1.59 u[IU]/mL (ref 0.35–4.50)

## 2020-04-21 ENCOUNTER — Other Ambulatory Visit: Payer: Self-pay | Admitting: Family

## 2020-04-21 DIAGNOSIS — F411 Generalized anxiety disorder: Secondary | ICD-10-CM

## 2020-04-21 DIAGNOSIS — I10 Essential (primary) hypertension: Secondary | ICD-10-CM

## 2020-06-24 ENCOUNTER — Other Ambulatory Visit: Payer: Self-pay | Admitting: Internal Medicine

## 2020-06-24 DIAGNOSIS — I10 Essential (primary) hypertension: Secondary | ICD-10-CM

## 2020-08-09 ENCOUNTER — Other Ambulatory Visit (INDEPENDENT_AMBULATORY_CARE_PROVIDER_SITE_OTHER): Payer: No Typology Code available for payment source

## 2020-08-09 ENCOUNTER — Other Ambulatory Visit: Payer: Self-pay

## 2020-08-09 DIAGNOSIS — Z0000 Encounter for general adult medical examination without abnormal findings: Secondary | ICD-10-CM

## 2020-08-09 LAB — LDL CHOLESTEROL, DIRECT: Direct LDL: 109 mg/dL

## 2020-08-09 LAB — CBC WITH DIFFERENTIAL/PLATELET
Basophils Absolute: 0.1 10*3/uL (ref 0.0–0.1)
Basophils Relative: 0.6 % (ref 0.0–3.0)
Eosinophils Absolute: 0.2 10*3/uL (ref 0.0–0.7)
Eosinophils Relative: 2.2 % (ref 0.0–5.0)
HCT: 40.5 % (ref 36.0–46.0)
Hemoglobin: 13.6 g/dL (ref 12.0–15.0)
Lymphocytes Relative: 31.3 % (ref 12.0–46.0)
Lymphs Abs: 3.2 10*3/uL (ref 0.7–4.0)
MCHC: 33.7 g/dL (ref 30.0–36.0)
MCV: 90.2 fl (ref 78.0–100.0)
Monocytes Absolute: 0.6 10*3/uL (ref 0.1–1.0)
Monocytes Relative: 6.4 % (ref 3.0–12.0)
Neutro Abs: 6 10*3/uL (ref 1.4–7.7)
Neutrophils Relative %: 59.5 % (ref 43.0–77.0)
Platelets: 237 10*3/uL (ref 150.0–400.0)
RBC: 4.49 Mil/uL (ref 3.87–5.11)
RDW: 12.7 % (ref 11.5–15.5)
WBC: 10.1 10*3/uL (ref 4.0–10.5)

## 2020-08-09 LAB — LIPID PANEL
Cholesterol: 194 mg/dL (ref 0–200)
HDL: 44.8 mg/dL (ref >39.00)
NonHDL: 149.61
Total CHOL/HDL Ratio: 4
Triglycerides: 285 mg/dL — ABNORMAL HIGH (ref 0.0–149.0)
VLDL: 57 mg/dL — ABNORMAL HIGH (ref 0.0–40.0)

## 2020-08-09 LAB — VITAMIN D 25 HYDROXY (VIT D DEFICIENCY, FRACTURES): VITD: 24.48 ng/mL — ABNORMAL LOW (ref 30.00–100.00)

## 2020-08-10 ENCOUNTER — Other Ambulatory Visit: Payer: Self-pay | Admitting: Family

## 2020-08-10 DIAGNOSIS — E559 Vitamin D deficiency, unspecified: Secondary | ICD-10-CM

## 2020-08-10 MED ORDER — CHOLECALCIFEROL 1.25 MG (50000 UT) PO TABS: ORAL_TABLET | ORAL | 0 refills | Status: DC

## 2020-08-11 ENCOUNTER — Other Ambulatory Visit: Payer: Self-pay

## 2020-08-11 ENCOUNTER — Encounter: Payer: Self-pay | Admitting: Family

## 2020-08-11 ENCOUNTER — Ambulatory Visit: Payer: No Typology Code available for payment source | Admitting: Family

## 2020-08-11 VITALS — BP 130/80 | HR 91 | Temp 98.1°F | Ht 69.0 in | Wt 196.0 lb

## 2020-08-11 DIAGNOSIS — I10 Essential (primary) hypertension: Secondary | ICD-10-CM

## 2020-08-11 DIAGNOSIS — Z1231 Encounter for screening mammogram for malignant neoplasm of breast: Secondary | ICD-10-CM | POA: Diagnosis not present

## 2020-08-11 DIAGNOSIS — E7800 Pure hypercholesterolemia, unspecified: Secondary | ICD-10-CM

## 2020-08-11 DIAGNOSIS — I341 Nonrheumatic mitral (valve) prolapse: Secondary | ICD-10-CM | POA: Diagnosis not present

## 2020-08-11 DIAGNOSIS — Z23 Encounter for immunization: Secondary | ICD-10-CM | POA: Diagnosis not present

## 2020-08-11 DIAGNOSIS — F411 Generalized anxiety disorder: Secondary | ICD-10-CM | POA: Diagnosis not present

## 2020-08-11 NOTE — Assessment & Plan Note (Signed)
Well controlled. Continue xanax 0.5mg  prn, zoloft 100mg .

## 2020-08-11 NOTE — Progress Notes (Signed)
Subjective:    Patient ID: Danielle Humphrey, female    DOB: [birth year], 59 y.o.   MRN: [id number]  CC: LUCEAL HOLLIBAUGH is a 59 y.o. female who presents today for follow up.   HPI: Feels well today No new complaints  Plans to go back to weight watchers to help her loose weight she gained back.   HTN- compliant with telmisartan, felodipine. No cp, sob,   GAD- controlled with regimen. continues to use xanax very sparingly. Feels that zoloft is very helpful. Sleeping well.   HLD- taking crestor 20mg  3 days per week. No myalgias.     HISTORY:  Past Medical History:  Diagnosis Date  . Chickenpox 1960  . Hypercholesteremia 2017  . Hypertension 1980  . Mitral valve prolapse 2016  . Shingles 2016   Past Surgical History:  Procedure Laterality Date  . 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 APPENDECTOMY Bilateral 1968  . BREAST EXCISIONAL BIOPSY Bilateral 2005   benign  . BREAST LUMPECTOMY Bilateral 2005  . COLONOSCOPY WITH PROPOFOL N/A 12/12/2019   Procedure: COLONOSCOPY WITH PROPOFOL;  Surgeon: Lin Landsman, MD;  Location: [facility] ENDOSCOPY;  Service: Gastroenterology;  Laterality: N/A;   Family History  Problem Relation Age of Onset  . Breast cancer Mother 62       2005  . Dementia Mother   . Stroke Mother   . Lupus Mother   . Hypercholesterolemia Father   . Heart attack Father        16  . Hypertension Father   . Diabetes type II Sister   . Heart disease Brother   . Ovarian cancer Neg Hx     Allergies: Codeine and Methotrexate derivatives Current Outpatient Medications on File Prior to Visit  Medication Sig Dispense Refill  . ALPRAZolam (XANAX) 0.5 MG tablet TAKE ONE TABLET BY MOUTH ONCE DAILY AS NEEDED FOR ANXIETY 30 tablet 1  . Cholecalciferol 1.25 MG (50000 UT) TABS 50,000 units PO qwk for 8 weeks. 8 tablet 0  . felodipine (PLENDIL) 10 MG 24 hr tablet TAKE 1 TABLET BY MOUTH EVERY DAY 30 tablet 5  . rosuvastatin (CRESTOR) 10 MG tablet Take 1 tablet (10  mg total) by mouth 3 (three) times a week. 36 tablet 3  . sertraline (ZOLOFT) 100 MG tablet TAKE 1 TABLET BY MOUTH EVERYDAY AT BEDTIME 30 tablet 14  . telmisartan (MICARDIS) 40 MG tablet TAKE 1 TABLET BY MOUTH EVERY DAY 30 tablet 5   No current facility-administered medications on file prior to visit.    Social History   Tobacco Use  . Smoking status: Never Smoker  . Smokeless tobacco: Never Used  Vaping Use  . Vaping Use: Never used  Substance Use Topics  . Alcohol use: Yes    Alcohol/week: 7.0 standard drinks    Types: 3 Glasses of wine, 4 Cans of beer per week    Comment: bourbon and diet ginger ale, red wine 3 x per week.  . Drug use: No    Review of Systems  Constitutional: Negative for chills and fever.  Respiratory: Negative for cough.   Cardiovascular: Negative for chest pain and palpitations.  Gastrointestinal: Negative for nausea and vomiting.  Psychiatric/Behavioral: Negative for sleep disturbance. The patient is not nervous/anxious (controlled).       Objective:    BP 130/80 (BP Location: Left Arm, Patient Position: Sitting, Cuff Size: Normal)   Pulse 91   Temp 98.1 F (36.7 C) (Oral)  Ht 5\' 9"  (1.753 m)   Wt 196 lb (88.9 kg)   SpO2 97%   BMI 28.94 kg/m  BP Readings from Last 3 Encounters:  08/11/20 130/80  02/09/20 120/78  01/01/20 120/70   Wt Readings from Last 3 Encounters:  08/11/20 196 lb (88.9 kg)  02/09/20 187 lb 6.4 oz (85 kg)  01/01/20 189 lb 2 oz (85.8 kg)    Physical Exam Vitals reviewed.  Constitutional:      Appearance: She is well-developed.  Eyes:     Conjunctiva/sclera: Conjunctivae normal.  Cardiovascular:     Rate and Rhythm: Normal rate and regular rhythm.     Pulses: Normal pulses.     Heart sounds: Normal heart sounds.  Pulmonary:     Effort: Pulmonary effort is normal.     Breath sounds: Normal breath sounds. No wheezing, rhonchi or rales.  Skin:    General: Skin is warm and dry.  Neurological:     Mental Status:  She is alert.  Psychiatric:        Speech: Speech normal.        Behavior: Behavior normal.        Thought Content: Thought content normal.        Assessment & Plan:   Problem List Items Addressed This Visit      Cardiovascular and Mediastinum   HTN (hypertension) - Primary    Stable. Continue telmisartan 40mg  and felodipine 10mg .       Mitral valve prolapse     Other   Generalized anxiety disorder    Well controlled. Continue xanax 0.5mg  prn, zoloft 100mg .       HLD (hyperlipidemia)    Improved, discussed goal LDL < 100. She will increase crestor to 4 days per week.        Other Visit Diagnoses    Encounter for screening mammogram for malignant neoplasm of breast       Relevant Orders   MM 3D SCREEN BREAST BILATERAL       I am having Danielle Humphrey maintain her rosuvastatin, sertraline, telmisartan, ALPRAZolam, felodipine, and Cholecalciferol.   No orders of the defined types were placed in this encounter.   Return precautions given.   Risks, benefits, and alternatives of the medications and treatment plan prescribed today were discussed, and patient expressed understanding.   Education regarding symptom management and diagnosis given to patient on AVS.  Continue to follow with Burnard Hawthorne, FNP for routine health maintenance.   Danielle Humphrey and I agreed with plan.   Mable Paris, FNP

## 2020-08-11 NOTE — Patient Instructions (Addendum)
We discussed Saxenda and Wellbutrin today. Happy to discuss in the future.  Please call  and schedule your 3D mammogram due 11/03/20 as discussed.   [place]  [place], [place]

## 2020-08-11 NOTE — Assessment & Plan Note (Signed)
Stable. Continue telmisartan 40mg  and felodipine 10mg .

## 2020-08-11 NOTE — Assessment & Plan Note (Signed)
Improved, discussed goal LDL < 100. She will increase crestor to 4 days per week.

## 2020-08-11 NOTE — Addendum Note (Signed)
Addended by: Sherrilee Gilles B on: 08/11/2020 08:58 AM   Modules accepted: Orders

## 2020-09-03 ENCOUNTER — Encounter: Payer: Self-pay | Admitting: Family

## 2020-10-23 ENCOUNTER — Other Ambulatory Visit: Payer: Self-pay | Admitting: Family

## 2020-10-23 DIAGNOSIS — I10 Essential (primary) hypertension: Secondary | ICD-10-CM

## 2020-11-10 ENCOUNTER — Other Ambulatory Visit: Payer: Self-pay

## 2020-11-12 ENCOUNTER — Other Ambulatory Visit: Payer: Self-pay

## 2020-11-12 ENCOUNTER — Encounter: Payer: Self-pay | Admitting: Family

## 2020-11-12 ENCOUNTER — Ambulatory Visit: Payer: No Typology Code available for payment source | Admitting: Family

## 2020-11-12 VITALS — BP 142/90 | HR 73 | Temp 98.9°F | Ht 69.02 in | Wt 198.8 lb

## 2020-11-12 DIAGNOSIS — E559 Vitamin D deficiency, unspecified: Secondary | ICD-10-CM | POA: Diagnosis not present

## 2020-11-12 DIAGNOSIS — E7800 Pure hypercholesterolemia, unspecified: Secondary | ICD-10-CM

## 2020-11-12 DIAGNOSIS — F411 Generalized anxiety disorder: Secondary | ICD-10-CM | POA: Diagnosis not present

## 2020-11-12 DIAGNOSIS — I10 Essential (primary) hypertension: Secondary | ICD-10-CM | POA: Diagnosis not present

## 2020-11-12 LAB — COMPREHENSIVE METABOLIC PANEL
ALT: 12 U/L (ref 0–35)
AST: 14 U/L (ref 0–37)
Albumin: 4.5 g/dL (ref 3.5–5.2)
Alkaline Phosphatase: 44 U/L (ref 39–117)
BUN: 17 mg/dL (ref 6–23)
CO2: 25 mEq/L (ref 19–32)
Calcium: 9.9 mg/dL (ref 8.4–10.5)
Chloride: 106 mEq/L (ref 96–112)
Creatinine, Ser: 0.87 mg/dL (ref 0.40–1.20)
GFR: 71.89 mL/min (ref >60.00)
Glucose, Bld: 99 mg/dL (ref 70–99)
Potassium: 3.8 mEq/L (ref 3.5–5.1)
Sodium: 139 mEq/L (ref 135–145)
Total Bilirubin: 0.5 mg/dL (ref 0.2–1.2)
Total Protein: 7.3 g/dL (ref 6.0–8.3)

## 2020-11-12 LAB — LIPID PANEL
Cholesterol: 201 mg/dL — ABNORMAL HIGH (ref 0–200)
HDL: 51.9 mg/dL (ref >39.00)
LDL Cholesterol: 128 mg/dL — ABNORMAL HIGH (ref 0–99)
NonHDL: 149.18
Total CHOL/HDL Ratio: 4
Triglycerides: 107 mg/dL (ref 0.0–149.0)
VLDL: 21.4 mg/dL (ref 0.0–40.0)

## 2020-11-12 LAB — VITAMIN D 25 HYDROXY (VIT D DEFICIENCY, FRACTURES): VITD: 34.31 ng/mL (ref 30.00–100.00)

## 2020-11-12 NOTE — Assessment & Plan Note (Signed)
Well controlled. Very rare use of xanax 0.5mg . continue zoloft 100mg 

## 2020-11-12 NOTE — Assessment & Plan Note (Signed)
Elevated today. Patient politely declines increasing regimen today and would like to monitor at home. She will call with readings from home. Continue plendil 10mg  , telmisartan 40mg 

## 2020-11-12 NOTE — Progress Notes (Signed)
Subjective:    Patient ID: Danielle Humphrey, female    DOB: Jan 07, 1961, 60 y.o.   MRN: [id number]  CC: Danielle Humphrey is a 60 y.o. female who presents today for follow up.   HPI: Feels well today No complaints   HTN- compliant with plendil 10mg  , telmisartan 40mg . No cp.   HLD- increased crestor to 5  days per week. No arthralgias.   Depression and anxiety- compliant with xanax 0.5mg  ( very rare use) , zoloft 100mg      HISTORY:  Past Medical History:  Diagnosis Date  . Chickenpox 1960  . Hypercholesteremia 2017  . Hypertension 1980  . Mitral valve prolapse 2016  . Shingles 2016   Past Surgical History:  Procedure Laterality Date  . 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 APPENDECTOMY Bilateral 1968  . BREAST EXCISIONAL BIOPSY Bilateral 2005   benign  . BREAST LUMPECTOMY Bilateral 2005  . COLONOSCOPY WITH PROPOFOL N/A 12/12/2019   Procedure: COLONOSCOPY WITH PROPOFOL;  Surgeon: Lin Landsman, MD;  Location: [facility] ENDOSCOPY;  Service: Gastroenterology;  Laterality: N/A;   Family History  Problem Relation Age of Onset  . Breast cancer Mother 16       2005  . Dementia Mother   . Stroke Mother   . Lupus Mother   . Hypercholesterolemia Father   . Heart attack Father        74  . Hypertension Father   . Diabetes type II Sister   . Heart disease Brother   . Ovarian cancer Neg Hx     Allergies: Codeine and Methotrexate derivatives Current Outpatient Medications on File Prior to Visit  Medication Sig Dispense Refill  . ALPRAZolam (XANAX) 0.5 MG tablet TAKE ONE TABLET BY MOUTH ONCE DAILY AS NEEDED FOR ANXIETY 30 tablet 1  . Cholecalciferol 1.25 MG (50000 UT) TABS 50,000 units PO qwk for 8 weeks. 8 tablet 0  . felodipine (PLENDIL) 10 MG 24 hr tablet TAKE 1 TABLET BY MOUTH EVERY DAY 30 tablet 5  . rosuvastatin (CRESTOR) 10 MG tablet Take 1 tablet (10 mg total) by mouth 3 (three) times a week. 36 tablet 3  . sertraline (ZOLOFT) 100 MG tablet TAKE 1 TABLET BY  MOUTH EVERYDAY AT BEDTIME 30 tablet 14  . telmisartan (MICARDIS) 40 MG tablet TAKE 1 TABLET BY MOUTH EVERY DAY 30 tablet 5   No current facility-administered medications on file prior to visit.    Social History   Tobacco Use  . Smoking status: Never Smoker  . Smokeless tobacco: Never Used  Vaping Use  . Vaping Use: Never used  Substance Use Topics  . Alcohol use: Yes    Alcohol/week: 7.0 standard drinks    Types: 3 Glasses of wine, 4 Cans of beer per week    Comment: bourbon and diet ginger ale, red wine 3 x per week.  . Drug use: No    Review of Systems  Constitutional: Negative for chills and fever.  Respiratory: Negative for cough.   Cardiovascular: Negative for chest pain and palpitations.  Gastrointestinal: Negative for nausea and vomiting.      Objective:    BP (!) 142/90 (BP Location: Left Arm, Patient Position: Sitting)   Pulse 73   Temp 98.9 F (37.2 C)   Ht 5' 9.02" (1.753 m)   Wt 198 lb 12.8 oz (90.2 kg)   SpO2 97%   BMI 29.34 kg/m  BP Readings from Last 3 Encounters:  11/12/20 Marland Kitchen)  142/90  08/11/20 130/80  02/09/20 120/78   Wt Readings from Last 3 Encounters:  11/12/20 198 lb 12.8 oz (90.2 kg)  08/11/20 196 lb (88.9 kg)  02/09/20 187 lb 6.4 oz (85 kg)    Physical Exam Vitals reviewed.  Constitutional:      Appearance: She is well-developed and well-nourished.  Eyes:     Conjunctiva/sclera: Conjunctivae normal.  Cardiovascular:     Rate and Rhythm: Normal rate and regular rhythm.     Pulses: Normal pulses.     Heart sounds: Normal heart sounds.  Pulmonary:     Effort: Pulmonary effort is normal.     Breath sounds: Normal breath sounds. No wheezing, rhonchi or rales.  Skin:    General: Skin is warm and dry.  Neurological:     Mental Status: She is alert.  Psychiatric:        Mood and Affect: Mood and affect normal.        Speech: Speech normal.        Behavior: Behavior normal.        Thought Content: Thought content normal.         Assessment & Plan:   Problem List Items Addressed This Visit      Cardiovascular and Mediastinum   HTN (hypertension) - Primary    Elevated today. Patient politely declines increasing regimen today and would like to monitor at home. She will call with readings from home. Continue plendil 10mg  , telmisartan 40mg       Relevant Orders   Comprehensive metabolic panel     Other   Generalized anxiety disorder    Well controlled. Very rare use of xanax 0.5mg . continue zoloft 100mg       HLD (hyperlipidemia)    Anticipate improved. Continue crestor 10mg       Relevant Orders   Lipid panel    Other Visit Diagnoses    Vitamin D deficiency       Relevant Orders   VITAMIN D 25 Hydroxy (Vit-D Deficiency, Fractures)       I am having Danielle Humphrey maintain her rosuvastatin, sertraline, ALPRAZolam, felodipine, Cholecalciferol, and telmisartan.   No orders of the defined types were placed in this encounter.   Return precautions given.   Risks, benefits, and alternatives of the medications and treatment plan prescribed today were discussed, and patient expressed understanding.   Education regarding symptom management and diagnosis given to patient on AVS.  Continue to follow with Burnard Hawthorne, FNP for routine health maintenance.   Danielle Humphrey and I agreed with plan.   Mable Paris, FNP

## 2020-11-12 NOTE — Patient Instructions (Signed)
It is imperative that you are seen AT least twice per year for labs and monitoring. Monitor blood pressure at home and me 5-6 reading on separate days. Goal is less than 120/80, based on newest guidelines, however we certainly want to be less than 130/80;  if persistently higher, please make sooner follow up appointment so we can recheck you blood pressure and manage/ adjust medications.  Nice to see you!   

## 2020-11-12 NOTE — Assessment & Plan Note (Signed)
Anticipate improved. Continue crestor 10mg 

## 2020-12-06 ENCOUNTER — Encounter: Payer: Self-pay | Admitting: Family

## 2020-12-07 ENCOUNTER — Other Ambulatory Visit: Payer: Self-pay

## 2020-12-07 ENCOUNTER — Ambulatory Visit
Admission: EM | Admit: 2020-12-07 | Discharge: 2020-12-07 | Disposition: A | Discharge status: Home / Self Care | Payer: No Typology Code available for payment source | Attending: Family Medicine | Admitting: Family Medicine

## 2020-12-07 DIAGNOSIS — R079 Chest pain, unspecified: Secondary | ICD-10-CM

## 2020-12-07 DIAGNOSIS — I10 Essential (primary) hypertension: Secondary | ICD-10-CM | POA: Diagnosis not present

## 2020-12-07 NOTE — ED Triage Notes (Signed)
Patient presents to Urgent Care with complaints of high blood pressure. Pt states she is on BP pressure meds. and BP ranges 130's. Pt states she had PCP visit a month ago, BP was slightly elevated since that visit so dose was changed but continues to have elevated BP ranging in the 140-150's. She spoke with triage nurse today and was instructed to come to UC for EKG and for elevated BP readings. Pt states she did exp some chest pain on Sat but since resolved. Pt has a hx of acid reflux and shares that chest pain was located at the center of chest. She states she took her acid reflux med with no relief.

## 2020-12-07 NOTE — Telephone Encounter (Signed)
Pt called to give results of the EKG

## 2020-12-07 NOTE — Discharge Instructions (Signed)
I am concerned about your heart based on what you are telling me You need very quick follow up with cardiology. If your symptoms return, chest pain, you need to go straight to the ER.  I have scheduled you for cardiology tomorrow morning at 10 am.

## 2020-12-07 NOTE — Progress Notes (Signed)
Office Visit    Patient Name: Danielle Humphrey Date of Encounter: 12/08/2020  PCP:  Burnard Hawthorne, [facility]  Cardiologist:  Kate Sable, MD  Advanced Practice Provider:  No care team member to display Electrophysiologist:  None   Chief Complaint    Danielle Humphrey is a 60 y.o. female with a hx of hypertension, generalized anxiety disorder, hyperlipidemia, mitral valve prolapse presents today for chest pain and elevated blood pressure.    Past Medical History    Past Medical History:  Diagnosis Date  . Chickenpox 1960  . Hypercholesteremia 2017  . Hypertension 1980  . Mitral valve prolapse 2016  . Shingles 2016   Past Surgical History:  Procedure Laterality Date  . 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 APPENDECTOMY Bilateral 1968  . BREAST EXCISIONAL BIOPSY Bilateral 2005   benign  . BREAST LUMPECTOMY Bilateral 2005  . COLONOSCOPY WITH PROPOFOL N/A 12/12/2019   Procedure: COLONOSCOPY WITH PROPOFOL;  Surgeon: Lin Landsman, MD;  Location: [facility] ENDOSCOPY;  Service: Gastroenterology;  Laterality: N/A;    Allergies  Allergies  Allergen Reactions  . Codeine Anaphylaxis  . Methotrexate Derivatives Anaphylaxis    History of Present Illness    Danielle Humphrey is a 60 y.o. female with a hx of hypertension, generalized anxiety disorder, hyperlipidemia, mitral valve prolapse last seen by Dr. Garen Lah 01/01/20.  Family history notable for coronary artery disease. She has been previously intolerant to statins, but tolerates Crestor 10mg  daily.  Echo 12/16/19 with mild to moderate MR, EF 60-65%.   She was seen at urgent care 12/07/20 with elevated blood pressure, intermittent chest pressure, headache, vision changes over the prior month. She previously trialed acid reflux medication with no significant improvement. EKG at urgent care NSR with sinus arrhythmia and inferior infarct unable to be excluded.   Presents today for  follow up. BP at home has routinely been 140-150s/90-105 on an arm cuff. This has been ongoing over the last month. She had an episode of chest pain Saturday which she woke up with. Felt like a tightness. She had pizza the night before for dinner. She took a Tums with no relief. It lasted all day and occurred at rest and with exertion. She laid down Saturday to go to sleep and felt very nauseous. Her daughter and granddaughter both recently had the stomach bug. Chest discomfort has not recurred.   Reports vision changes. Endorses auras and blurriness. Notices difficulty reading her computer at work. No amaurosis fugax. Last eye exam less than 1 year ago. No noted triggering factors. She was previously told it was migraines related to her cycle though wonders if this is now different as she is post-menopausal. Notices they are occurring more frequently with multiple episodes per week.   Notices "horrible sleep" for 2 weeks to month. She is wearing a smart watch that she got for Christmas. Tells me she falls asleep easily and feels she slept well but her device will tell her she is not getting deep sleep. She has not had a sleep study. She does not wake up tired, does not feel she needs a nap, she does snore.   EKGs/Labs/Other Studies Reviewed:   The following studies were reviewed today:   EKG:  EKG is ordered today.  The ekg ordered today demonstrates NSR 73 bpm with no acute St/T wave changes.   Recent Labs: 03/05/2020: TSH 1.59 08/09/2020: Hemoglobin 13.6; Platelets 237.0  11/12/2020: ALT 12; BUN 17; Creatinine, Ser 0.87; Potassium 3.8; Sodium 139  Recent Lipid Panel    Component Value Date/Time   CHOL 201 (H) 11/12/2020 0933   TRIG 107.0 11/12/2020 0933   HDL 51.90 11/12/2020 0933   CHOLHDL 4 11/12/2020 0933   VLDL 21.4 11/12/2020 0933   LDLCALC 128 (H) 11/12/2020 0933   LDLDIRECT 109.0 08/09/2020 0826    Home Medications   Current Meds  Medication Sig  . ALPRAZolam (XANAX) 0.5 MG  tablet TAKE ONE TABLET BY MOUTH ONCE DAILY AS NEEDED FOR ANXIETY  . CHOLECALCIFEROL PO Take 800 Int'l Units by mouth.  . felodipine (PLENDIL) 10 MG 24 hr tablet TAKE 1 TABLET BY MOUTH EVERY DAY  . rosuvastatin (CRESTOR) 10 MG tablet Take 1 tablet (10 mg total) by mouth 3 (three) times a week. (Patient taking differently: Take 10 mg by mouth as directed. 5 times per week)  . sertraline (ZOLOFT) 100 MG tablet TAKE 1 TABLET BY MOUTH EVERYDAY AT BEDTIME  . [DISCONTINUED] telmisartan (MICARDIS) 40 MG tablet TAKE 1 TABLET BY MOUTH EVERY DAY     Review of Systems  All other systems reviewed and are otherwise negative except as noted above.  Physical Exam    VS:  BP 120/80 (BP Location: Left Arm, Patient Position: Sitting, Cuff Size: Large)   Pulse 73   Ht 5' 9.5" (1.765 m)   Wt 197 lb (89.4 kg)   SpO2 98%   BMI 28.67 kg/m  , BMI Body mass index is 28.67 kg/m.  Wt Readings from Last 3 Encounters:  12/08/20 197 lb (89.4 kg)  12/07/20 196 lb (88.9 kg)  11/12/20 198 lb 12.8 oz (90.2 kg)    GEN: Well nourished, overweight, well developed, in no acute distress. HEENT: normal. Neck: Supple, no JVD, carotid bruits, or masses. Cardiac: RRR, no murmurs, rubs, or gallops. No clubbing, cyanosis, edema.  Radials/PT 2+ and equal bilaterally.  Respiratory:  Respirations regular and unlabored, clear to auscultation bilaterally. GI: Soft, nontender, nondistended. MS: No deformity or atrophy. Skin: Warm and dry, no rash. Neuro:  Strength and sensation are intact. Psych: Normal affect.  Assessment & Plan    1. Atypical chest pain - Isolated episode after eating pizza the night before which occurred at rest and activity lasting all day despite Tums. NotedEKG today NSR with no acute ST/T wave changes. Low suspicion cardiac etiology. No indication for ischemic evaluation at this time. Will assess for wall motion abnormalities on upcoming echocardiogram. If noted, would likely benefit from cardiac CTA  given family history of CAD.  2. HTN - Well controlled in clinic today though elevated at recent office visits and by home readings. Encouraged to check BP 1-2 hours after her medications as she has been checking only 30 minutes after. Increase Telmisartan to 60mg  QD. BMP, TSH today. To assess kidney function and rule out thyroid as contributory. May need to consider sleep study, as below.   3. Snores - Notes poor sleep, tells me smart watch tells her she does not get deep sleep. No morning fatigue nor daytime sleepiness. Does endorse snoring. In setting of hypertension, would likely benefit from sleep study. Consider sleep study with [place] watch at follow up as it will likely be available in our clinic at that time.   4. MR - mild to moderate by echo 11/2019. Upcoming repeat echocardiogram 12/31/20 for monitoring.   5. HLD - 11/12/20 LDL 128. She has increased her Crestor to 10mg  five times per week  instead of three. Continue to follow with primary care provider.   Disposition: Follow up in 4 week(s) with Dr. Garen Lah or APP   Signed, Loel Dubonnet, NP 12/08/2020, 11:04 AM [place]

## 2020-12-07 NOTE — ED Provider Notes (Signed)
[facility]    CSN: [id number] Arrival date & time: 12/07/20  1019      History   Chief Complaint Chief Complaint  Patient presents with  . Hypertension    HPI Danielle Humphrey is a 60 y.o. female.   Patient is a 61 year old female with past medical history of high cholesterol, hypertension, mitral valve prolapse.  She is presenting today with complaints of elevated blood pressure readings, intermittent chest pressure, headaches, vision changes.  This is been a waxing and waning issue over the past month.  Some days she is feeling more well than others.  Yesterday she left work due to not feeling well.  No current chest pain but did have episode of chest pain with radiation to the arm and jaw area.  Sees cardiology yearly for mitral valve regurgitation.  Family does have a significant cardiac history with her father having a massive heart attack approximately 20 years ago.  She does not smoke.  Thought maybe the chest pressure was  related to acid reflux but tried acid reflux medicine with no resolvent of symptoms.     Past Medical History:  Diagnosis Date  . Chickenpox 1960  . Hypercholesteremia 2017  . Hypertension 1980  . Mitral valve prolapse 2016  . Shingles 2016    Patient Active Problem List   Diagnosis Date Noted  . Cough 10/11/2018  . Mitral valve regurgitation 07/05/2018  . Depression, major, single episode, severe ([place]) 06/28/2018  . Hot flashes 06/28/2018  . Left ankle swelling 05/21/2017  . Routine physical examination 04/24/2017  . HTN (hypertension) 10/19/2016  . HLD (hyperlipidemia) 10/19/2016  . Generalized anxiety disorder 10/19/2016  . Mitral valve prolapse 2016  . Dyspnea on exertion 11/09/2013  . Dizziness 11/09/2013    Past Surgical History:  Procedure Laterality Date  . 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 APPENDECTOMY Bilateral 1968  . BREAST EXCISIONAL BIOPSY Bilateral 2005   benign  . BREAST LUMPECTOMY Bilateral 2005   . COLONOSCOPY WITH PROPOFOL N/A 12/12/2019   Procedure: COLONOSCOPY WITH PROPOFOL;  Surgeon: Lin Landsman, MD;  Location: [facility] ENDOSCOPY;  Service: Gastroenterology;  Laterality: N/A;    OB History   No obstetric history on file.      Home Medications    Prior to Admission medications   Medication Sig Start Date End Date Taking? Authorizing Provider  ALPRAZolam Duanne Moron) 0.5 MG tablet TAKE ONE TABLET BY MOUTH ONCE DAILY AS NEEDED FOR ANXIETY 04/23/20   Burnard Hawthorne, FNP  Cholecalciferol 1.25 MG (50000 UT) TABS 50,000 units PO qwk for 8 weeks. 08/10/20   Burnard Hawthorne, FNP  felodipine (PLENDIL) 10 MG 24 hr tablet TAKE 1 TABLET BY MOUTH EVERY DAY 06/24/20   Crecencio Mc, MD  rosuvastatin (CRESTOR) 10 MG tablet Take 1 tablet (10 mg total) by mouth 3 (three) times a week. 11/28/19   Burnard Hawthorne, FNP  sertraline (ZOLOFT) 100 MG tablet TAKE 1 TABLET BY MOUTH EVERYDAY AT BEDTIME 02/02/20   Burnard Hawthorne, FNP  telmisartan (MICARDIS) 40 MG tablet TAKE 1 TABLET BY MOUTH EVERY DAY 10/25/20   Burnard Hawthorne, FNP    Family History Family History  Problem Relation Age of Onset  . Breast cancer Mother 17       2005  . Dementia Mother   . Stroke Mother   . Lupus Mother   . Hypercholesterolemia Father   . Heart attack Father  1998  . Hypertension Father   . Diabetes type II Sister   . Heart disease Brother   . Ovarian cancer Neg Hx     Social History Social History   Tobacco Use  . Smoking status: Never Smoker  . Smokeless tobacco: Never Used  Vaping Use  . Vaping Use: Never used  Substance Use Topics  . Alcohol use: Yes    Alcohol/week: 7.0 standard drinks    Types: 3 Glasses of wine, 4 Cans of beer per week    Comment: bourbon and diet ginger ale, red wine 3 x per week.  . Drug use: No     Allergies   Codeine and Methotrexate derivatives   Review of Systems Review of Systems   Physical Exam Triage Vital Signs ED Triage Vitals   Enc Vitals Group     BP 12/07/20 1029 (!) 163/102     Pulse Rate 12/07/20 1029 72     Resp 12/07/20 1029 18     Temp 12/07/20 1029 98.9 F (37.2 C)     Temp Source 12/07/20 1029 Oral     SpO2 12/07/20 1029 97 %     Weight 12/07/20 1033 196 lb (88.9 kg)     Height --      Head Circumference --      Peak Flow --      Pain Score 12/07/20 1033 0     Pain Loc --      Pain Edu? --      Excl. in [place]? --    No data found.  Updated Vital Signs BP (!) 163/102 (BP Location: Left Arm)   Pulse 72   Temp 98.9 F (37.2 C) (Oral)   Resp 18   Wt 196 lb (88.9 kg)   SpO2 97%   BMI 28.93 kg/m   Visual Acuity Right Eye Distance:   Left Eye Distance:   Bilateral Distance:    Right Eye Near:   Left Eye Near:    Bilateral Near:     Physical Exam Vitals and nursing note reviewed.  Constitutional:      General: She is not in acute distress.    Appearance: Normal appearance. She is not ill-appearing, toxic-appearing or diaphoretic.  HENT:     Head: Normocephalic.  Eyes:     Conjunctiva/sclera: Conjunctivae normal.  Neck:     Vascular: No carotid bruit.  Cardiovascular:     Rate and Rhythm: Normal rate and regular rhythm.  Pulmonary:     Effort: Pulmonary effort is normal.     Breath sounds: Normal breath sounds.  Musculoskeletal:        General: Normal range of motion.     Cervical back: Normal range of motion.  Skin:    General: Skin is warm and dry.     Findings: No rash.  Neurological:     Mental Status: She is alert.  Psychiatric:        Mood and Affect: Mood normal.      UC Treatments / Results  Labs (all labs ordered are listed, but only abnormal results are displayed) Labs Reviewed - No data to display  EKG   Radiology No results found.  Procedures Procedures (including critical care time)  Medications Ordered in UC Medications - No data to display  Initial Impression / Assessment and Plan / UC Course  I have reviewed the triage vital signs and the  nursing notes.  Pertinent labs & imaging results that were available during my  care of the patient were reviewed by me and considered in my medical decision making (see chart for details).     Essential hypertension, chest pain. Spoke with patient about concern for possible cardiac as being the cause of her chest pain, elevated BP and other symptoms. She has family hx of cardiac disease and risk factors with hypertension, high cholesterol.  Since it has been a month and she is asymptomatic today I feel she can follow up with cardiology ASAP instead of going to the ER. Called and they were able to schedule her for tomorrow at 10 am. Strict ER precautions given.  EKG with normal sinus rhythm with sinus arhythmia. Inferior infarct cannot be ruled out.  No specific ST elevation.  Vital stable.  Pt understanding and agreed to plan.  Final Clinical Impressions(s) / UC Diagnoses   Final diagnoses:  Essential hypertension  Chest pain, unspecified type     Discharge Instructions     I am concerned about your heart based on what you are telling me You need very quick follow up with cardiology. If your symptoms return, chest pain, you need to go straight to the ER.  I have scheduled you for cardiology tomorrow morning at 10 am.      ED Prescriptions    None     PDMP not reviewed this encounter.   Orvan July, NP 12/07/20 1157

## 2020-12-08 ENCOUNTER — Encounter: Payer: Self-pay | Admitting: Family

## 2020-12-08 ENCOUNTER — Ambulatory Visit (INDEPENDENT_AMBULATORY_CARE_PROVIDER_SITE_OTHER): Payer: No Typology Code available for payment source | Admitting: Family

## 2020-12-08 VITALS — BP 120/80 | HR 73 | Ht 69.5 in | Wt 197.0 lb

## 2020-12-08 DIAGNOSIS — R0683 Snoring: Secondary | ICD-10-CM | POA: Diagnosis not present

## 2020-12-08 DIAGNOSIS — I10 Essential (primary) hypertension: Secondary | ICD-10-CM

## 2020-12-08 DIAGNOSIS — E782 Mixed hyperlipidemia: Secondary | ICD-10-CM

## 2020-12-08 MED ORDER — TELMISARTAN 40 MG PO TABS: 60.0000 mg | ORAL_TABLET | Freq: Every day | ORAL | 5 refills | Status: DC

## 2020-12-08 NOTE — Patient Instructions (Addendum)
Medication Instructions:  Your provider has recommended you make the following change in your medication:   CHANGE Telmisartan to 60mg  (1.5 tablets) daily   *If you need a refill on your cardiac medications before your next appointment, please call your pharmacy*   Lab Work: Your provider recommends that you return for lab work today: BMP, TSH  If you have labs (blood work) drawn today and your tests are completely normal, you will receive your results only by: Marland Kitchen MyChart Message (if you have MyChart) OR . A paper copy in the mail If you have any lab test that is abnormal or we need to change your treatment, we will call you to review the results.  Testing/Procedures: Your EKG today shows normal sinus rhythm   You have an echocardiogram scheduled for 12/31/20.   Follow-Up: At [facility], you and your health needs are our priority.  As part of our continuing mission to provide you with exceptional heart care, we have created designated Provider Care Teams.  These Care Teams include your primary Cardiologist (physician) and Advanced Practice Providers (APPs -  Physician Assistants and Nurse Practitioners) who all work together to provide you with the care you need, when you need it.   Your next appointment:   4 week(s)  The format for your next appointment:   In Person  Provider:   You may see Kate Sable, MD or one of the following Advanced Practice Providers on your designated Care Team:    Murray Hodgkins, NP  Christell Faith, PA-C  Marrianne Mood, PA-C  Cadence Kathlen Mody, [place]  Laurann Montana, NP  Other Instructions  Tips to Measure your Blood Pressure Correctly  National and international guidelines offer specific instructions for measuring blood pressure. If a doctor, nurse, or medical assistant isn't doing it right, don't hesitate to ask him or her to get with the guidelines.  Here's what you can do to ensure a correct reading: . Don't drink a caffeinated  beverage or smoke during the 30 minutes before the test. . Sit quietly for five minutes before the test begins. . During the measurement, sit in a chair with your feet on the floor and your arm supported so your elbow is at about heart level. . The inflatable part of the cuff should completely cover at least 80% of your upper arm, and the cuff should be placed on bare skin, not over a shirt. . Don't talk during the measurement. . Have your blood pressure measured twice, with a brief break in between. If the readings are different by 5 points or more, have it done a third time.  There are times to break these rules. If you sometimes feel lightheaded when getting out of bed in the morning or when you stand after sitting, you should have your blood pressure checked while seated and then while standing to see if it falls from one position to the next.  It's a good idea to have your blood pressure measured in both arms at least once, since the reading in one arm (usually the right) may be higher than that in the left. A 2014 study in The American Journal of Medicine of nearly 3,400 people found average arm- to-arm differences in systolic blood pressure of about 5 points. The higher number should be used to make treatment decisions.  In 2017, new guidelines from the [employer], the [employer] of Cardiology, and nine other health organizations lowered the diagnosis of high blood pressure to 130/80 mm  Hg or higher for all adults. The guidelines also redefined the various blood pressure categories to now include normal, elevated, Stage 1 hypertension, Stage 2 hypertension, and hypertensive crisis (see "Blood pressure categories").  Blood pressure categories  Blood pressure category SYSTOLIC (upper number)  DIASTOLIC (lower number)  Normal Less than 120 mm Hg and Less than 80 mm Hg  Elevated 120-129 mm Hg and Less than 80 mm Hg  High blood pressure: Stage 1 hypertension 130-139 mm Hg  or 80-89 mm Hg  High blood pressure: Stage 2 hypertension 140 mm Hg or higher or 90 mm Hg or higher  Hypertensive crisis (consult your doctor immediately) Higher than 180 mm Hg and/or Higher than 120 mm Hg  Source: American Heart Association and American Stroke Association. For more on getting your blood pressure under control, buy Controlling Your Blood Pressure, a Special Health Report from [facility].   Blood Pressure Log   Date   Time  Blood Pressure  Position  Example: Nov 1 9 AM 124/78 sitting

## 2020-12-09 LAB — BASIC METABOLIC PANEL
BUN/Creatinine Ratio: 18 (ref 12–28)
BUN: 16 mg/dL (ref 8–27)
CO2: 18 mmol/L — ABNORMAL LOW (ref 20–29)
Calcium: 9.9 mg/dL (ref 8.7–10.3)
Chloride: 103 mmol/L (ref 96–106)
Creatinine, Ser: 0.9 mg/dL (ref 0.57–1.00)
Glucose: 103 mg/dL — ABNORMAL HIGH (ref 65–99)
Potassium: 4 mmol/L (ref 3.5–5.2)
Sodium: 139 mmol/L (ref 134–144)
eGFR: 73 mL/min/{1.73_m2} (ref >59)

## 2020-12-09 LAB — TSH: TSH: 3.45 u[IU]/mL (ref 0.450–4.500)

## 2020-12-21 ENCOUNTER — Other Ambulatory Visit: Payer: Self-pay | Admitting: Internal Medicine

## 2020-12-21 DIAGNOSIS — I10 Essential (primary) hypertension: Secondary | ICD-10-CM

## 2020-12-31 ENCOUNTER — Other Ambulatory Visit: Payer: Self-pay

## 2020-12-31 ENCOUNTER — Ambulatory Visit (INDEPENDENT_AMBULATORY_CARE_PROVIDER_SITE_OTHER): Payer: No Typology Code available for payment source

## 2020-12-31 DIAGNOSIS — I340 Nonrheumatic mitral (valve) insufficiency: Secondary | ICD-10-CM | POA: Diagnosis not present

## 2020-12-31 LAB — ECHOCARDIOGRAM COMPLETE
AR max vel: 2.79 cm2
AV Area VTI: 2.69 cm2
AV Area mean vel: 2.5 cm2
AV Mean grad: 4 mmHg
AV Peak grad: 8 mmHg
Ao pk vel: 1.41 m/s
Area-P 1/2: 2.06 cm2
Calc EF: 57.8 %
Single Plane A2C EF: 56.2 %
Single Plane A4C EF: 58.2 %

## 2021-01-02 IMAGING — MG DIGITAL SCREENING BILAT W/ TOMO W/ CAD
6 of 10 series · 6 of 30 positions shown · non-contrast
Comparison: Previous exam(s).

CLINICAL DATA: Screening.

EXAM:
DIGITAL SCREENING BILATERAL MAMMOGRAM WITH TOMO AND CAD

[R CC synth-2D]
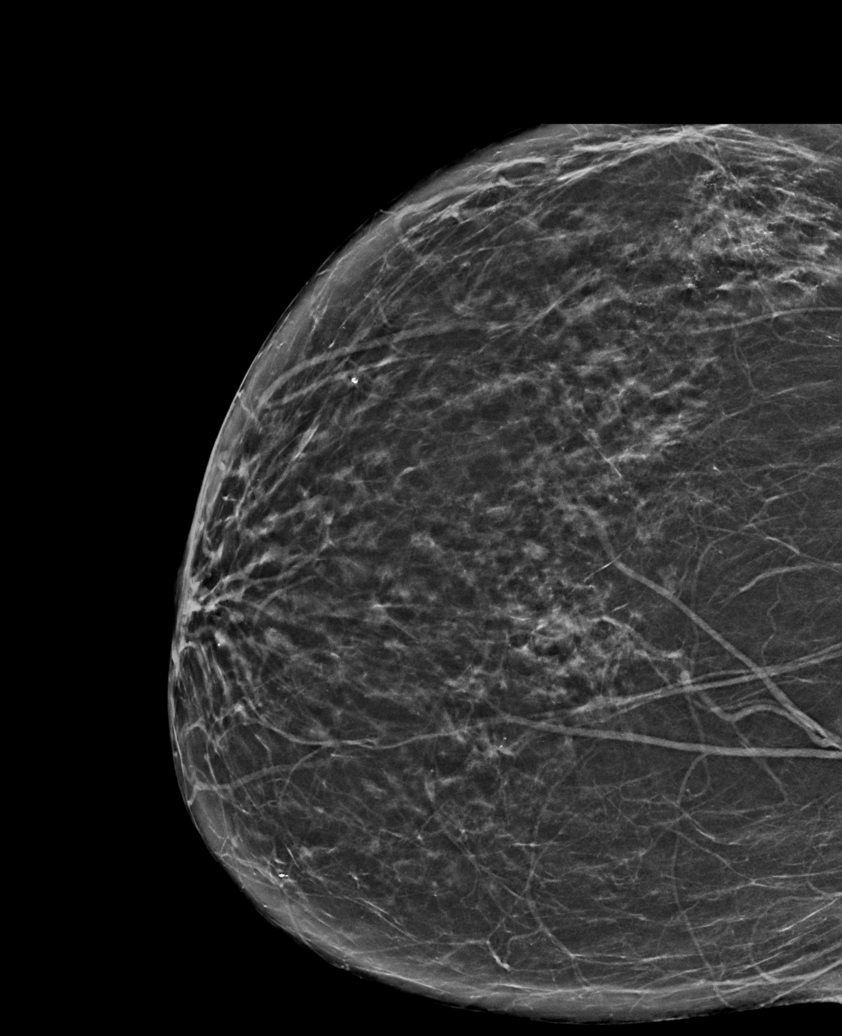

[R MLO synth-2D (1 of 2)]
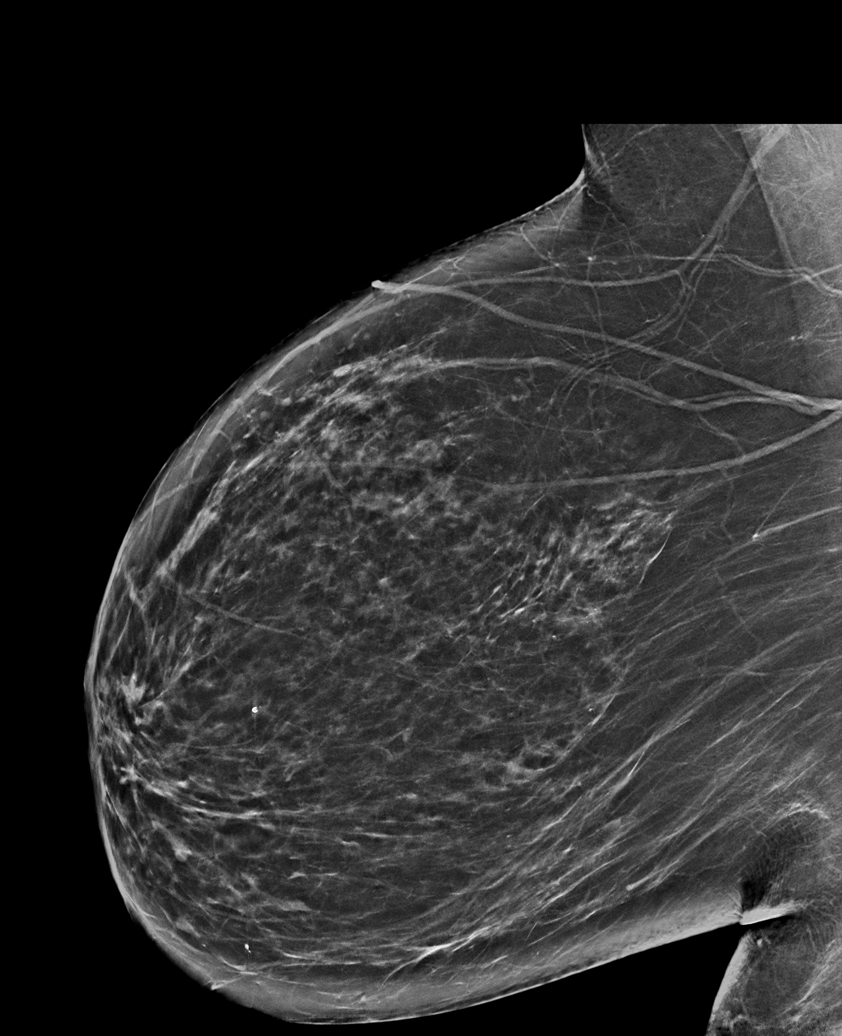

[R MLO synth-2D (2 of 2)]
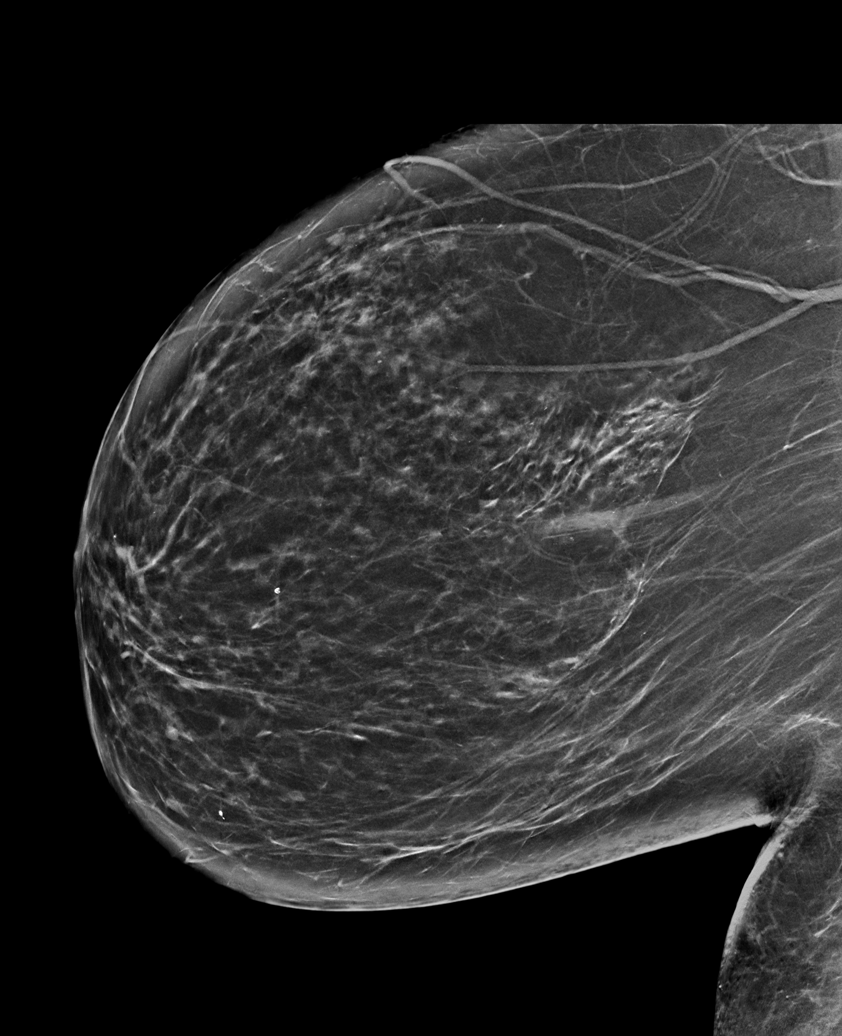

[L MLO synth-2D]
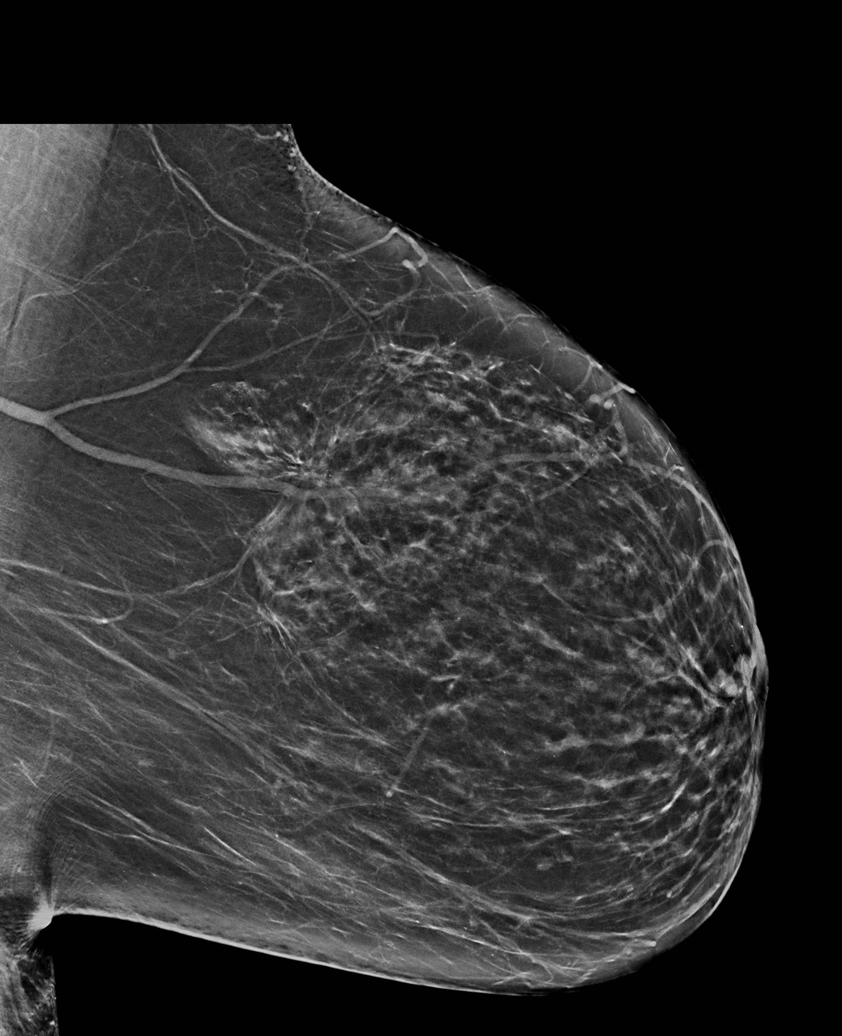

[L CC synth-2D]
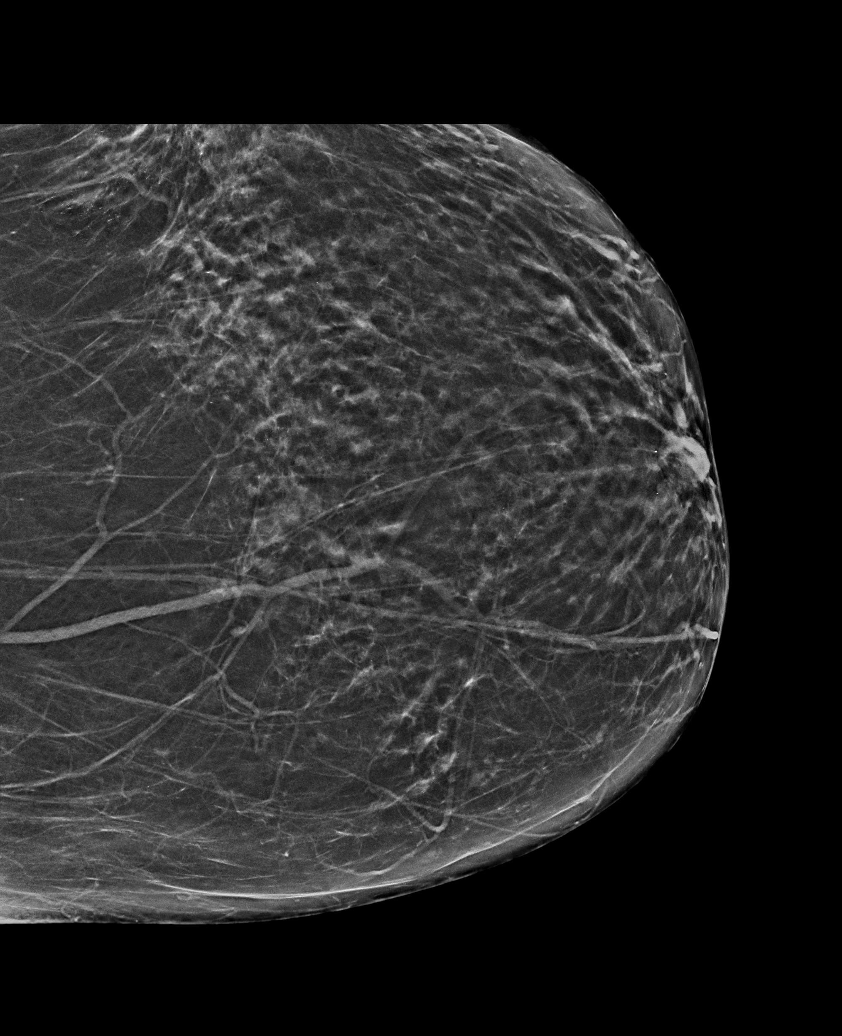

[R MLO tomo · tomo slice 37/72.0]
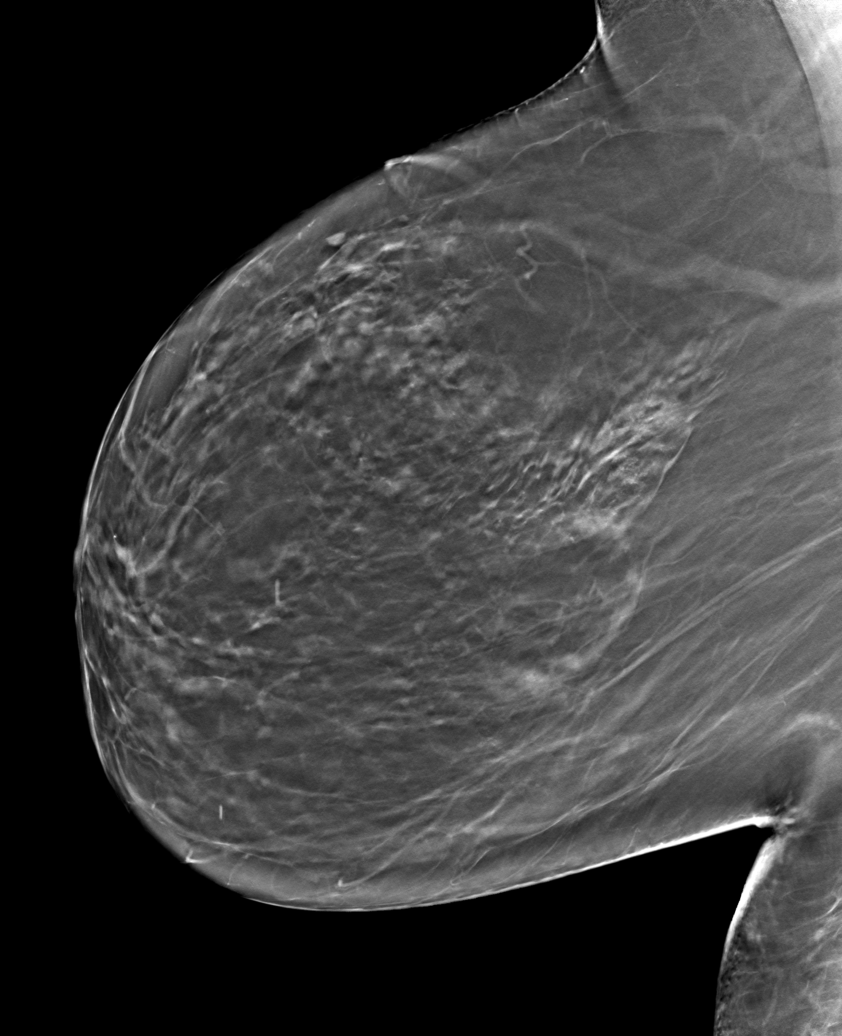

[6 of 30 positions shown; findings below may reference images not displayed]

ACR Breast Density Category b: There are scattered areas of
fibroglandular density.
FINDINGS: There are no findings suspicious for malignancy. Images were
processed with CAD.
IMPRESSION: No mammographic evidence of malignancy. A result letter of this
screening mammogram will be mailed directly to the patient.

RECOMMENDATION:
Screening mammogram in one year. (Code:[id number])

BI-RADS CATEGORY  1: Negative.

## 2021-01-03 ENCOUNTER — Telehealth: Payer: Self-pay

## 2021-01-03 NOTE — Telephone Encounter (Signed)
Janan Ridge, [place]  01/03/2021 1:13 PM EDT Back to Top     No answer. LMOV. Phone note started   Kate Sable, MD  01/03/2021 1:01 PM EDT      Echocardiogram showed normal systolic function, impaired relaxation, mild to moderate mitral valve regurgitation noted. Mitral valve regurgitation is stable compared to last echocardiogram about a year ago. Continue medications as prescribed

## 2021-01-05 ENCOUNTER — Other Ambulatory Visit: Payer: Self-pay

## 2021-01-05 ENCOUNTER — Encounter: Payer: Self-pay | Admitting: Family

## 2021-01-05 ENCOUNTER — Telehealth: Payer: Self-pay

## 2021-01-05 ENCOUNTER — Ambulatory Visit (INDEPENDENT_AMBULATORY_CARE_PROVIDER_SITE_OTHER): Payer: No Typology Code available for payment source | Admitting: Family

## 2021-01-05 VITALS — BP 140/88 | HR 72 | Ht 69.5 in | Wt 198.0 lb

## 2021-01-05 DIAGNOSIS — R0683 Snoring: Secondary | ICD-10-CM | POA: Diagnosis not present

## 2021-01-05 DIAGNOSIS — G4730 Sleep apnea, unspecified: Secondary | ICD-10-CM

## 2021-01-05 DIAGNOSIS — G4710 Hypersomnia, unspecified: Secondary | ICD-10-CM

## 2021-01-05 DIAGNOSIS — I340 Nonrheumatic mitral (valve) insufficiency: Secondary | ICD-10-CM

## 2021-01-05 DIAGNOSIS — I10 Essential (primary) hypertension: Secondary | ICD-10-CM

## 2021-01-05 MED ORDER — TELMISARTAN 80 MG PO TABS: 80.0000 mg | ORAL_TABLET | Freq: Every day | ORAL | 1 refills | Status: DC

## 2021-01-05 NOTE — Telephone Encounter (Signed)
Per BAS [phone]), no prior auth is required for CPT 95800.  Call to patient PIN provided and instructions for WatchPat One reviewed, patient verbalized understanding.  Confirmation # [id number].   Georgana Curio MHA RN CCM

## 2021-01-05 NOTE — Progress Notes (Signed)
Erroneous encounter. Please disregard.  Loel Dubonnet, NP

## 2021-01-05 NOTE — Progress Notes (Signed)
Office Visit    Patient Name: Danielle Humphrey Date of Encounter: 01/05/2021  PCP:  Burnard Hawthorne, [facility]  Cardiologist:  Kate Sable, MD  Advanced Practice Provider:  No care team member to display Electrophysiologist:  None   Chief Complaint    Danielle Humphrey is a 60 y.o. female with a hx of hypertension, generalized anxiety disorder, hyperlipidemia, mitral valve prolapse presents today for follow-up after echocardiogram.  Past Medical History    Past Medical History:  Diagnosis Date  . Chickenpox 1960  . Hypercholesteremia 2017  . Hypertension 1980  . Mitral valve prolapse 2016  . Shingles 2016   Past Surgical History:  Procedure Laterality Date  . 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 APPENDECTOMY Bilateral 1968  . BREAST EXCISIONAL BIOPSY Bilateral 2005   benign  . BREAST LUMPECTOMY Bilateral 2005  . COLONOSCOPY WITH PROPOFOL N/A 12/12/2019   Procedure: COLONOSCOPY WITH PROPOFOL;  Surgeon: Lin Landsman, MD;  Location: [facility] ENDOSCOPY;  Service: Gastroenterology;  Laterality: N/A;    Allergies  Allergies  Allergen Reactions  . Codeine Anaphylaxis  . Methotrexate Derivatives Anaphylaxis    History of Present Illness    Danielle Humphrey is a 60 y.o. female with a hx of hypertension, generalized anxiety disorder, hyperlipidemia, mitral valve prolapse last seen 12/08/20.  Family history notable for coronary artery disease. She has been previously intolerant to statins, but tolerates Crestor 10mg  daily.  Echo 12/16/19 with mild to moderate MR, EF 60-65%.   She was seen at urgent care 12/07/20 with elevated blood pressure, intermittent chest pressure, headache, vision changes over the prior month. She previously trialed acid reflux medication with no significant improvement. EKG at urgent care NSR with sinus arrhythmia and inferior infarct unable to be excluded.   Seen in follow-up 12/08/2020.  Blood pressure was  elevated on SH her telmisartan to increase to 60 mg daily.  Her chest discomfort was associated with nausea and indigestion.  She was also recommended for echocardiogram which was performed 12/31/2020 showing normal LVEF 60 to 95%, grade 1 diastolic dysfunction, RV normal size and function, LA mildly dilated, mild to moderate MR.  Unable to exclude prolapse of the posterior leaflet, not well visualized.  She presents today for follow up.  Tells me her blood pressure at home has improved and is no longer running in the 160s to 170s but is still above goal of 130/80.  She has been compliant with, start 60 mg daily as well as felodipine 10 mg daily.  She denies chest pain, pressure, tightness.  Reports no shortness of breath or dyspnea on exertion.  Endorses continued snoring and nonrestorative sleep.  As her blood pressure has slowly increased and required more antihypertensive therapy there is suspicion for sleep apnea.  STOP-BANG Rex assessment  S-snore  Have you been told that you snore?   Yes  T-tired Are you often tired, fatigued, sleepy during the day?   Yes  O-obstruction Do you stop breathing, choke, or gasp during sleep?   Yes  P-pressure Do you have or are you being treated for high blood pressure?  Yes  B- BMI Is your BMI greater than 35 kg/m?  No  A- age Are you 76 years or older?  Yes  N-neck Do you have a neck circumference greater than 16 inches?  No  G-gender Are you female?   No  Total  5   Risk  Score: ?3 High risk of OSA Additional clinical symptoms: Excessive daytime sleepiness G 47.10, Loud snoring R0 6.83, Unrefreshed by sleep G 47.8, History of high blood pressure R03.0  EKGs/Labs/Other Studies Reviewed:   The following studies were reviewed today:  Echo 12/31/20  1. Left ventricular ejection fraction, by estimation, is 60 to 65%. The  left ventricle has normal function. The left ventricle has no regional  wall motion abnormalities. Left ventricular diastolic  parameters are  consistent with Grade I diastolic  dysfunction (impaired relaxation).   2. Right ventricular systolic function is normal. The right ventricular  size is normal.   3. Left atrial size was moderately dilated.   4. The mitral valve is normal in structure. Mild to moderate mitral valve  regurgitation. Unable to exclude prolpase of the posterior leaflet, not  well visualized. No evidence of mitral stenosis.    EKG:  No EKG is ordered today.  The ekg independently reviewed from 12/08/20 demonstrated NSR 73 bpm with no acute St/T wave changes.   Recent Labs: 08/09/2020: Hemoglobin 13.6; Platelets 237.0 11/12/2020: ALT 12 12/08/2020: BUN 16; Creatinine, Ser 0.90; Potassium 4.0; Sodium 139; TSH 3.450  Recent Lipid Panel    Component Value Date/Time   CHOL 201 (H) 11/12/2020 0933   TRIG 107.0 11/12/2020 0933   HDL 51.90 11/12/2020 0933   CHOLHDL 4 11/12/2020 0933   VLDL 21.4 11/12/2020 0933   LDLCALC 128 (H) 11/12/2020 0933   LDLDIRECT 109.0 08/09/2020 0826    Home Medications   Current Meds  Medication Sig  . ALPRAZolam (XANAX) 0.5 MG tablet TAKE ONE TABLET BY MOUTH ONCE DAILY AS NEEDED FOR ANXIETY  . felodipine (PLENDIL) 10 MG 24 hr tablet TAKE 1 TABLET BY MOUTH EVERY DAY  . rosuvastatin (CRESTOR) 10 MG tablet Take 10 mg by mouth as directed. Per patient 5 times a week  . sertraline (ZOLOFT) 100 MG tablet TAKE 1 TABLET BY MOUTH EVERYDAY AT BEDTIME  . telmisartan (MICARDIS) 80 MG tablet Take 1 tablet (80 mg total) by mouth daily.  Marland Kitchen VITAMIN D, CHOLECALCIFEROL, PO Take by mouth.  . [DISCONTINUED] rosuvastatin (CRESTOR) 10 MG tablet Take 1 tablet (10 mg total) by mouth 3 (three) times a week.  . [DISCONTINUED] telmisartan (MICARDIS) 40 MG tablet Take 1.5 tablets (60 mg total) by mouth daily.     Review of Systems  All other systems reviewed and are otherwise negative except as noted above.  Physical Exam    VS:  BP 140/88 (BP Location: Left Arm, Patient Position:  Sitting, Cuff Size: Normal)   Pulse 72   Ht 5' 9.5" (1.765 m)   Wt 198 lb (89.8 kg)   SpO2 97%   BMI 28.82 kg/m  , BMI Body mass index is 28.82 kg/m.  Wt Readings from Last 3 Encounters:  01/05/21 198 lb (89.8 kg)  12/08/20 197 lb (89.4 kg)  12/07/20 196 lb (88.9 kg)    GEN: Well nourished, overweight, well developed, in no acute distress. HEENT: normal. Neck: Supple, no JVD, carotid bruits, or masses. Cardiac: RRR, no murmurs, rubs, or gallops. No clubbing, cyanosis, edema.  Radials/PT 2+ and equal bilaterally.  Respiratory:  Respirations regular and unlabored, clear to auscultation bilaterally. GI: Soft, nontender, nondistended. MS: No deformity or atrophy. Skin: Warm and dry, no rash. Neuro:  Strength and sensation are intact. Psych: Normal affect.  Assessment & Plan    1. Atypical chest pain - No recurrence. No indication for ischemic evaluation at this time.   2.  HTN - BP improved though not to goal. We will increase her Telmisartan to 80mg  daily.  With BMP at the medical mall in 1 week for monitoring of renal function.  Recommend healthy diet and regular cardiovascular exercise.  3. Snores - Notes poor sleep, tells me smart watch tells her she does not get deep sleep. Does endorse snoring, fatigue, non-restorative sleep. She has a Stop-BANG score of 5 and clinical symptoms including daytime sleepiness, loud snoring, unrefreshed sleep, history of high blood pressure.. In setting of hypertension requiring additional antihypertensive agents and nonrestorative sleep, would benefit from sleep study.  Plan for [place] WatchPAT sleep study. She was provided this in clinic.  4. MR - mild to moderate by echo 12/2020.  Continue optimal BP and volume control.  5. HLD - 11/12/20 LDL 128. She has increased her Crestor to 10mg  five times per week instead of three. Continue to follow with primary care provider.   Disposition: Follow up in 2 month(s) with Dr. Garen Lah or APP    Signed, Loel Dubonnet, NP 01/05/2021, 9:39 AM [place]

## 2021-01-05 NOTE — Progress Notes (Signed)
This encounter was created in error - please disregard.

## 2021-01-05 NOTE — Patient Instructions (Addendum)
Medication Instructions:  Your physician has recommended you make the following change in your medication:   CHANGE Telmisartan to 80mg  daily   *If you need a refill on your cardiac medications before your next appointment, please call your pharmacy*   Lab Work: Your physician recommends that you return for lab work in 1 week at [facility] for [facility].   Medical Mall Entrance at [facility] 1st desk on the right to check in, past the screening table Lab hours: Monday- Friday (7:30 am- 5:30 pm)  If you have labs (blood work) drawn today and your tests are completely normal, you will receive your results only by: Marland Kitchen MyChart Message (if you have MyChart) OR . A paper copy in the mail If you have any lab test that is abnormal or we need to change your treatment, we will call you to review the results.  Testing/Procedures:  WatchPAT?  Is a FDA cleared portable home sleep study test that uses a watch and 3 points of contact to monitor 7 different channels, including your heart rate, oxygen saturations, body position, snoring, and chest motion.  The study is easy to use from the comfort of your own home and accurately detect sleep apnea.  Before bed, you attach the chest sensor, attached the sleep apnea bracelet to your nondominant hand, and attach the finger probe.  After the study, the raw data is downloaded from the watch and scored for apnea events.   For more information: [email or web address]  We will call you after we receive notification that your insurance has approved. We will give you PIN number required to begin study. At this time you will be able to open the box and begin study.   Follow-Up: At [facility], you and your health needs are our priority.  As part of our continuing mission to provide you with exceptional heart care, we have created designated Provider Care Teams.  These Care Teams include your primary Cardiologist (physician) and Advanced Practice  Providers (APPs -  Physician Assistants and Nurse Practitioners) who all work together to provide you with the care you need, when you need it.  We recommend signing up for the patient portal called "MyChart".  Sign up information is provided on this After Visit Summary.  MyChart is used to connect with patients for Virtual Visits (Telemedicine).  Patients are able to view lab/test results, encounter notes, upcoming appointments, etc.  Non-urgent messages can be sent to your provider as well.   To learn more about what you can do with MyChart, go to [email or web address].    Your next appointment:   2 month(s)  The format for your next appointment:   In Person  Provider:   You may see Kate Sable, MD or one of the following Advanced Practice Providers on your designated Care Team:    Murray Hodgkins, NP  Christell Faith, PA-C  Marrianne Mood, PA-C  Cadence Kathlen Mody, [place]  Laurann Montana, NP  Other Instructions  Heart Healthy Diet Recommendations: A low-salt diet is recommended. Meats should be grilled, baked, or boiled. Avoid fried foods. Focus on lean protein sources like fish or chicken with vegetables and fruits. The American Heart Association is a Microbiologist!  American Heart Association Diet and Lifeystyle Recommendations   Exercise recommendations: The American Heart Association recommends 150 minutes of moderate intensity exercise weekly. Try 30 minutes of moderate intensity exercise 4-5 times per week. This could include walking, jogging, or swimming.

## 2021-01-06 NOTE — Progress Notes (Signed)
Erroneous encounter  This encounter was created in error - please disregard.  This encounter was created in error - please disregard.  This encounter was created in error - please disregard.

## 2021-01-06 NOTE — Progress Notes (Signed)
   Subjective:    Patient ID: Danielle Humphrey, female    DOB: 01/25/1961, 60 y.o.   MRN: This encounter was created in error - please disregard.    Objective:   Physical Exam  This encounter was opened in error.      Assessment & Plan:

## 2021-01-12 ENCOUNTER — Other Ambulatory Visit: Payer: Self-pay | Admitting: Family

## 2021-02-09 ENCOUNTER — Other Ambulatory Visit: Payer: Self-pay

## 2021-02-09 ENCOUNTER — Encounter: Payer: Self-pay | Admitting: Family

## 2021-02-09 ENCOUNTER — Ambulatory Visit: Payer: No Typology Code available for payment source | Admitting: Family

## 2021-02-09 ENCOUNTER — Other Ambulatory Visit: Payer: Self-pay | Admitting: Family

## 2021-02-09 VITALS — BP 122/78 | Temp 98.5°F | Ht 69.5 in | Wt 199.0 lb

## 2021-02-09 DIAGNOSIS — E7800 Pure hypercholesterolemia, unspecified: Secondary | ICD-10-CM | POA: Diagnosis not present

## 2021-02-09 DIAGNOSIS — F322 Major depressive disorder, single episode, severe without psychotic features: Secondary | ICD-10-CM

## 2021-02-09 DIAGNOSIS — F411 Generalized anxiety disorder: Secondary | ICD-10-CM | POA: Diagnosis not present

## 2021-02-09 DIAGNOSIS — Z1231 Encounter for screening mammogram for malignant neoplasm of breast: Secondary | ICD-10-CM

## 2021-02-09 DIAGNOSIS — I10 Essential (primary) hypertension: Secondary | ICD-10-CM | POA: Diagnosis not present

## 2021-02-09 LAB — BASIC METABOLIC PANEL
BUN: 18 mg/dL (ref 6–23)
CO2: 21 mEq/L (ref 19–32)
Calcium: 9.9 mg/dL (ref 8.4–10.5)
Chloride: 106 mEq/L (ref 96–112)
Creatinine, Ser: 0.94 mg/dL (ref 0.40–1.20)
GFR: 65.4 mL/min (ref >60.00)
Glucose, Bld: 97 mg/dL (ref 70–99)
Potassium: 4.3 mEq/L (ref 3.5–5.1)
Sodium: 139 mEq/L (ref 135–145)

## 2021-02-09 LAB — LIPID PANEL
Cholesterol: 199 mg/dL (ref 0–200)
HDL: 51.7 mg/dL (ref >39.00)
LDL Cholesterol: 124 mg/dL — ABNORMAL HIGH (ref 0–99)
NonHDL: 146.87
Total CHOL/HDL Ratio: 4
Triglycerides: 115 mg/dL (ref 0.0–149.0)
VLDL: 23 mg/dL (ref 0.0–40.0)

## 2021-02-09 MED ORDER — ALPRAZOLAM 0.5 MG PO TABS: 0.5000 mg | ORAL_TABLET | Freq: Every day | ORAL | 1 refills | Status: DC | PRN

## 2021-02-09 NOTE — Progress Notes (Signed)
Subjective:    Patient ID: Danielle Humphrey, female    DOB: [birth year], 60 y.o.   MRN: [id number]  CC: Danielle Humphrey is a 60 y.o. female who presents today for follow up.   HPI: Feels well today No new complaints  No recurrence of CP.  No cp.  Anxiety improved although work continues tobe a stressor. She requests refill of xanax  HTN- compliant with felodipine 10mg , telmisartan 80mg  qd  Seen by cardiology 01/05/21 for atypical CP, MR Increased telmisartan to 80mg .  Pending sleep study as arranged by cardiology.   HLD- compliant with crestor 10mg  5 days per week with myalgia.   Due mammogram. Declines our scheduling today and she will schedule herself    HISTORY:  Past Medical History:  Diagnosis Date  . Chickenpox 1960  . Hypercholesteremia 2017  . Hypertension 1980  . Mitral valve prolapse 2016  . Shingles 2016   Past Surgical History:  Procedure Laterality Date  . 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 APPENDECTOMY Bilateral 1968  . BREAST EXCISIONAL BIOPSY Bilateral 2005   benign  . BREAST LUMPECTOMY Bilateral 2005  . COLONOSCOPY WITH PROPOFOL N/A 12/12/2019   Procedure: COLONOSCOPY WITH PROPOFOL;  Surgeon: Lin Landsman, MD;  [facility] ENDOSCOPY;  Service: Gastroenterology;  Laterality: N/A;   Family History  Problem Relation Age of Onset  . Breast cancer Mother 43       2005  . Dementia Mother   . Stroke Mother   . Lupus Mother   . Hypercholesterolemia Father   . Heart attack Father        5  . Hypertension Father   . Diabetes type II Sister   . Heart disease Brother   . Ovarian cancer Neg Hx     Allergies: Codeine and Methotrexate derivatives Current Outpatient Medications on File Prior to Visit  Medication Sig Dispense Refill  . felodipine (PLENDIL) 10 MG 24 hr tablet TAKE 1 TABLET BY MOUTH EVERY DAY 30 tablet 5  . rosuvastatin (CRESTOR) 10 MG tablet TAKE 1 TABLET (10 MG TOTAL) BY MOUTH 3 (THREE) TIMES A WEEK. 12 tablet 11  .  telmisartan (MICARDIS) 80 MG tablet Take 1 tablet (80 mg total) by mouth daily. 90 tablet 1  . VITAMIN D, CHOLECALCIFEROL, PO Take 800 Units by mouth daily.     No current facility-administered medications on file prior to visit.    Social History   Tobacco Use  . Smoking status: Never Smoker  . Smokeless tobacco: Never Used  Vaping Use  . Vaping Use: Never used  Substance Use Topics  . Alcohol use: Yes    Alcohol/week: 7.0 standard drinks    Types: 3 Glasses of wine, 4 Cans of beer per week    Comment: bourbon and diet ginger ale, red wine 3 x per week.  . Drug use: No    Review of Systems  Constitutional: Negative for chills and fever.  Respiratory: Negative for cough and shortness of breath.   Cardiovascular: Negative for chest pain and palpitations.  Gastrointestinal: Negative for nausea and vomiting.  Psychiatric/Behavioral: The patient is nervous/anxious (controlled).       Objective:    BP 122/78 (BP Location: Left Arm, Patient Position: Sitting, Cuff Size: Large)   Temp 98.5 F (36.9 C) (Oral)   Ht 5' 9.5" (1.765 m)   Wt 199 lb (90.3 kg)   SpO2 98%   BMI 28.97 kg/m  BP Readings from Last  3 Encounters:  02/09/21 122/78  01/05/21 140/88  12/08/20 120/80   Wt Readings from Last 3 Encounters:  02/09/21 199 lb (90.3 kg)  01/05/21 198 lb (89.8 kg)  12/08/20 197 lb (89.4 kg)    Physical Exam Vitals reviewed.  Constitutional:      Appearance: She is well-developed.  Eyes:     Conjunctiva/sclera: Conjunctivae normal.  Cardiovascular:     Rate and Rhythm: Normal rate and regular rhythm.     Pulses: Normal pulses.     Heart sounds: Normal heart sounds.  Pulmonary:     Effort: Pulmonary effort is normal.     Breath sounds: Normal breath sounds. No wheezing, rhonchi or rales.  Skin:    General: Skin is warm and dry.  Neurological:     Mental Status: She is alert.  Psychiatric:        Speech: Speech normal.        Behavior: Behavior normal.         Thought Content: Thought content normal.        Assessment & Plan:   Problem List Items Addressed This Visit      Cardiovascular and Mediastinum   HTN (hypertension) - Primary    Improved. Continue felodipine 10mg , telmisartan 80mg  qd       Relevant Orders   Basic metabolic panel     Other   Depression, major, single episode, severe (HCC)    Stable, chronic. Continue zoloft 100mg , xanax 0.5mg  qd prn.  I looked up patient on [place] Controlled Substances Reporting System PMP AWARE and saw no activity that raised concern of inappropriate use.        Relevant Medications   ALPRAZolam (XANAX) 0.5 MG tablet   Generalized anxiety disorder   Relevant Medications   ALPRAZolam (XANAX) 0.5 MG tablet   HLD (hyperlipidemia)    Anticipate improved. Continue crestor 5 days per week      Relevant Orders   Lipid panel    Other Visit Diagnoses    Encounter for screening mammogram for malignant neoplasm of breast       Relevant Orders   MM 3D SCREEN BREAST BILATERAL       I have changed Kristine Royal. Henrikson's ALPRAZolam. I am also having her maintain her felodipine, (VITAMIN D, CHOLECALCIFEROL, PO), telmisartan, and rosuvastatin.   Meds ordered this encounter  Medications  . ALPRAZolam (XANAX) 0.5 MG tablet    Sig: Take 1 tablet (0.5 mg total) by mouth daily as needed for anxiety.    Dispense:  30 tablet    Refill:  1    This request is for a new prescription for a controlled substance as required by Federal/State law.    Order Specific Question:   Supervising Provider    Answer:   Crecencio Mc [[id number]]    Return precautions given.   Risks, benefits, and alternatives of the medications and treatment plan prescribed today were discussed, and patient expressed understanding.   Education regarding symptom management and diagnosis given to patient on AVS.  Continue to follow with Burnard Hawthorne, FNP for routine health maintenance.   Danielle Humphrey and I agreed with plan.    Mable Paris, FNP

## 2021-02-09 NOTE — Assessment & Plan Note (Signed)
Improved. Continue felodipine 10mg , telmisartan 80mg  qd

## 2021-02-09 NOTE — Assessment & Plan Note (Signed)
Stable, chronic. Continue zoloft 100mg , xanax 0.5mg  qd prn.  I looked up patient on [place] Controlled Substances Reporting System PMP AWARE and saw no activity that raised concern of inappropriate use.

## 2021-02-09 NOTE — Assessment & Plan Note (Signed)
Anticipate improved. Continue crestor 5 days per week

## 2021-02-09 NOTE — Patient Instructions (Signed)
Please call  and schedule your 3D mammogram as discussed.   [facility]  [address]  [place], [place]  [phone]  

## 2021-03-05 ENCOUNTER — Other Ambulatory Visit: Payer: Self-pay | Admitting: Family

## 2021-03-05 DIAGNOSIS — F322 Major depressive disorder, single episode, severe without psychotic features: Secondary | ICD-10-CM

## 2021-03-07 ENCOUNTER — Ambulatory Visit (INDEPENDENT_AMBULATORY_CARE_PROVIDER_SITE_OTHER): Payer: No Typology Code available for payment source | Admitting: Cardiology

## 2021-03-07 ENCOUNTER — Telehealth: Payer: Self-pay | Admitting: Family

## 2021-03-07 ENCOUNTER — Other Ambulatory Visit: Payer: Self-pay

## 2021-03-07 ENCOUNTER — Encounter: Payer: Self-pay | Admitting: Cardiology

## 2021-03-07 VITALS — BP 132/80 | HR 60 | Ht 70.0 in | Wt 199.0 lb

## 2021-03-07 DIAGNOSIS — I340 Nonrheumatic mitral (valve) insufficiency: Secondary | ICD-10-CM

## 2021-03-07 DIAGNOSIS — I10 Essential (primary) hypertension: Secondary | ICD-10-CM | POA: Diagnosis not present

## 2021-03-07 DIAGNOSIS — E782 Mixed hyperlipidemia: Secondary | ICD-10-CM | POA: Diagnosis not present

## 2021-03-07 NOTE — Telephone Encounter (Signed)
Patient called and needs a refill/change in dosage 20mg  , 5x a week, per Ar[facility]. The medication is rosuvastatin (CRESTOR) 20 MG tablet

## 2021-03-07 NOTE — Patient Instructions (Signed)
Medication Instructions:   Your physician has recommended you make the following change in your medication:   Your physician recommends that you continue on your current medications as directed. Please refer to the Current Medication list given to you today.   *If you need a refill on your cardiac medications before your next appointment, please call your pharmacy*   Lab Work: None ordered If you have labs (blood work) drawn today and your tests are completely normal, you will receive your results only by: [place] (if you have MyChart) OR A paper copy in the mail If you have any lab test that is abnormal or we need to change your treatment, we will call you to review the results.   Testing/Procedures:  Your physician has requested that you have an echocardiogram in 12-18 mos. Echocardiography is a painless test that uses sound waves to create images of your heart. It provides your doctor with information about the size and shape of your heart and how well your heart's chambers and valves are working. This procedure takes approximately one hour. There are no restrictions for this procedure.    Follow-Up: At [facility], you and your health needs are our priority.  As part of our continuing mission to provide you with exceptional heart care, we have created designated Provider Care Teams.  These Care Teams include your primary Cardiologist (physician) and Advanced Practice Providers (APPs -  Physician Assistants and Nurse Practitioners) who all work together to provide you with the care you need, when you need it.  We recommend signing up for the patient portal called "MyChart".  Sign up information is provided on this After Visit Summary.  MyChart is used to connect with patients for Virtual Visits (Telemedicine).  Patients are able to view lab/test results, encounter notes, upcoming appointments, etc.  Non-urgent messages can be sent to your provider as well.   To learn more  about what you can do with MyChart, go to [email or web address].    Your next appointment:   Follow up after Echo in 12-18 months   The format for your next appointment:   In Person  Provider:   Kate Sable, MD   Other Instructions

## 2021-03-07 NOTE — Telephone Encounter (Signed)
Pt in the office today for appt.  Pt states that she is sleeping much better and does not wish to proceed with WatchPat sleep study at this time.  She plans to return unopened/sealed WatchPat device back to the office.  Notified pt she may leave at the front desk, and once received, I will cancel her registration.  Pt voiced understanding.

## 2021-03-07 NOTE — Progress Notes (Signed)
Cardiology Office Note:    Date:  03/07/2021   ID:  Danielle, Humphrey 02/28/1961, MRN [id number]  PCP:  Burnard Hawthorne, FNP  Cardiologist:  Kate Sable, MD  Electrophysiologist:  None   Referring MD: Burnard Hawthorne, FNP   Chief Complaint  Patient presents with   Other    2 month follow up. Meds reviewed verbally with patient.     History of Present Illness:    Danielle Humphrey is a 60 y.o. female with a hx of mild to moderate MR, hypertension, hyperlipidemia who presents for follow-up.    Patient is being seen yearly for MR monitoring.  Previous echocardiogram a year ago showed mild to moderate mitral regurgitation.  Had repeat echocardiogram on 12/31/2020.  Presents for follow-up.  Has no complaints or concerns at this time.  Denies chest pain or shortness of breath.  Tolerating all medications for hyperlipidemia, hypertension without any adverse effects.   Prior notes Echo 11/30/1694, normal systolic function, EF 78% mild to moderate MR Echocardiogram 9/38/1017 normal systolic function, EF 51% mild to moderate MR   Past Medical History:  Diagnosis Date   Chickenpox 1960   Hypercholesteremia 2017   Hypertension 1980   Mitral valve prolapse 2016   Shingles 2016    Past Surgical History:  Procedure Laterality Date   AMPUTATION FINGER Left 1981   third finger    APPENDECTOMY Bilateral 1968   BREAST EXCISIONAL BIOPSY Bilateral 2005   benign   BREAST LUMPECTOMY Bilateral 2005   COLONOSCOPY WITH PROPOFOL N/A 12/12/2019   Procedure: COLONOSCOPY WITH PROPOFOL;  Surgeon: Lin Landsman, MD;  Location: [facility];  Service: Gastroenterology;  Laterality: N/A;    Current Medications: Current Meds  Medication Sig   ALPRAZolam (XANAX) 0.5 MG tablet Take 1 tablet (0.5 mg total) by mouth daily as needed for anxiety.   felodipine (PLENDIL) 10 MG 24 hr tablet TAKE 1 TABLET BY MOUTH EVERY DAY   rosuvastatin (CRESTOR) 20 MG tablet Take one tablet (20MG ) 5 days a week    telmisartan (MICARDIS) 80 MG tablet Take 1 tablet (80 mg total) by mouth daily.   VITAMIN D, CHOLECALCIFEROL, PO Take 800 Units by mouth daily.   [DISCONTINUED] sertraline (ZOLOFT) 100 MG tablet TAKE 1 TABLET BY MOUTH EVERYDAY AT BEDTIME     Allergies:   Codeine and Methotrexate derivatives   Social History   Socioeconomic History   Marital status: Married    Spouse name: [name]   Number of children: 2   Years of education: 2   Highest education level: Not on file  Occupational History   Occupation: Associate Professor Paper  Tobacco Use   Smoking status: Never   Smokeless tobacco: Never  Vaping Use   Vaping Use: Never used  Substance and Sexual Activity   Alcohol use: Yes    Alcohol/week: 7.0 standard drinks    Types: 3 Glasses of wine, 4 Cans of beer per week    Comment: bourbon and diet ginger ale, red wine 3 x per week.   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 There is no history of Ovarian cancer.  ROS:   Please see the history of present illness.     All other systems reviewed and are negative.  EKGs/Labs/Other Studies Reviewed:    The following studies were reviewed today:   EKG:  EKG is  ordered today.  The ekg ordered  today demonstrates normal sinus rhythm, normal EKG.  Recent Labs: 08/09/2020: Hemoglobin 13.6; Platelets 237.0 11/12/2020: ALT 12 12/08/2020: TSH 3.450 02/09/2021: BUN 18; Creatinine, Ser 0.94; Potassium 4.3; Sodium 139  Recent Lipid Panel    Component Value Date/Time   CHOL 199 02/09/2021 0859   TRIG 115.0 02/09/2021 0859   HDL 51.70 02/09/2021 0859   CHOLHDL 4 02/09/2021 0859   VLDL 23.0 02/09/2021 0859   LDLCALC 124 (H) 02/09/2021 0859   LDLDIRECT 109.0 08/09/2020 0826    Physical Exam:    VS:  BP 132/80 (BP Location: Left Arm, Patient Position: Sitting, Cuff Size: Normal)   Pulse 60   Ht 5\' 10"  (1.778 m)   Wt 199 lb (90.3 kg)   SpO2 96%   BMI 28.55 kg/m     Wt Readings from Last 3 Encounters:  03/07/21 199 lb (90.3 kg)  02/09/21 199 lb (90.3 kg)  01/05/21 198 lb (89.8 kg)     GEN:  Well nourished, well developed in no acute distress HEENT: Normal NECK: No JVD; No carotid bruits LYMPHATICS: No lymphadenopathy CARDIAC: RRR, no murmurs, rubs, gallops RESPIRATORY:  Clear to auscultation without rales, wheezing or rhonchi  ABDOMEN: Soft, non-tender, non-distended MUSCULOSKELETAL:  No edema; No deformity  SKIN: Warm and dry NEUROLOGIC:  Alert and oriented x 3 PSYCHIATRIC:  Normal affect   ASSESSMENT:    1. Mitral valve insufficiency, unspecified etiology   2. Mixed hyperlipidemia   3. Primary hypertension     PLAN:    In order of problems listed above:  Patient with history of mild to moderate mitral valve regurgitation.  Repeat echocardiogram on 12/2020 showed mild to moderate MR which is stable compared to prior.  EF normal at 60%.  Plan for repeat echocardiogram in about a year to 18 months as per [facility] AHA guidelines. Patient has history of hyperlipidemia.  Continue Crestor. Blood pressure  controlled.  Continue telmisartan  Follow-up in 18 months after echocardiogram for mitral regurgitation follow-up  This note was generated in part or whole with voice  recognition software. Voice recognition is usually quite accurate but there are transcription errors that can and very often do occur. I apologize for any typographical errors that were not detected and corrected.  Medication Adjustments/Labs and Tests Ordered: Current medicines are reviewed at length with the patient today.  Concerns regarding medicines are outlined above.  Orders Placed This Encounter  Procedures   EKG 12-Lead   ECHOCARDIOGRAM COMPLETE    No orders of the defined types were placed in this encounter.   Patient Instructions  Medication Instructions:   Your physician has recommended you make the following change in your medication:   Your physician recommends that you  continue on your current medications as directed. Please refer to the Current Medication list given to you today.   *If you need a refill on your cardiac medications before your next appointment, please call your pharmacy*   Lab Work: None ordered If you have labs (blood work) drawn today and your tests are completely normal, you will receive your results only by: [place] (if you have MyChart) OR A paper copy in the mail If you have any lab test that is abnormal or we need to change your treatment, we will call you to review the results.   Testing/Procedures:  Your physician has requested that you have an echocardiogram in 12-18 mos. Echocardiography is a painless test that uses sound waves to create images of your heart. It provides your doctor with information about the size and shape of your heart and how well your heart's chambers and valves are working. This procedure takes approximately one hour. There are no restrictions for this procedure.    Follow-Up: At [facility], you and your health needs are our priority.  As part of our continuing mission to provide you with exceptional heart care, we have created designated Provider Care Teams.  These Care Teams include your primary  Cardiologist (physician) and Advanced Practice Providers (APPs -  Physician Assistants and Nurse Practitioners) who all work together to provide you with the care you need, when you need it.  We recommend signing up for the patient portal called "MyChart".  Sign up information is provided on this After Visit Summary.  MyChart is used to connect with patients for Virtual Visits (Telemedicine).  Patients are able to view lab/test results, encounter notes, upcoming appointments, etc.  Non-urgent messages can be sent to your provider as well.   To learn more about what you can do with MyChart, go to [email or web address].    Your next appointment:   Follow up after Echo in 12-18 months   The format for your next appointment:   In Person  Provider:   Kate Sable, MD   Other Instructions     Signed, Kate Sable, MD  03/07/2021 12:24 PM    [place]

## 2021-03-08 ENCOUNTER — Other Ambulatory Visit: Payer: Self-pay | Admitting: Family

## 2021-03-08 DIAGNOSIS — E7800 Pure hypercholesterolemia, unspecified: Secondary | ICD-10-CM

## 2021-03-08 MED ORDER — ROSUVASTATIN CALCIUM 20 MG PO TABS: ORAL_TABLET | ORAL | 2 refills | Status: DC

## 2021-03-09 NOTE — Telephone Encounter (Signed)
Patient dropped off unopened WatchPat device  Placed in nurse box

## 2021-03-10 NOTE — Telephone Encounter (Signed)
Received pt's unopened, sealed WatchPat one box at front desk.  Pt's WatchPat device has been unregistered. Confirmation received on CloudPat.  Unopened box has been replaced in designated area.

## 2021-05-09 ENCOUNTER — Encounter: Payer: Self-pay | Admitting: Family

## 2021-05-31 ENCOUNTER — Other Ambulatory Visit: Payer: Self-pay

## 2021-05-31 ENCOUNTER — Ambulatory Visit
Admission: RE | Admit: 2021-05-31 | Discharge: 2021-05-31 | Disposition: A | Discharge status: Home / Self Care | Payer: PRIVATE HEALTH INSURANCE | Source: Ambulatory Visit | Attending: Family | Admitting: Family

## 2021-05-31 DIAGNOSIS — Z1231 Encounter for screening mammogram for malignant neoplasm of breast: Secondary | ICD-10-CM | POA: Insufficient documentation

## 2021-06-19 ENCOUNTER — Other Ambulatory Visit: Payer: Self-pay | Admitting: Internal Medicine

## 2021-06-19 DIAGNOSIS — I10 Essential (primary) hypertension: Secondary | ICD-10-CM

## 2021-07-13 ENCOUNTER — Other Ambulatory Visit: Payer: Self-pay | Admitting: Family

## 2021-07-13 DIAGNOSIS — I10 Essential (primary) hypertension: Secondary | ICD-10-CM

## 2021-10-19 ENCOUNTER — Other Ambulatory Visit: Payer: Self-pay

## 2021-10-19 ENCOUNTER — Encounter: Payer: Self-pay | Admitting: Family

## 2021-10-19 ENCOUNTER — Ambulatory Visit: Payer: No Typology Code available for payment source | Admitting: Family

## 2021-10-19 ENCOUNTER — Telehealth: Payer: Self-pay | Admitting: Family

## 2021-10-19 VITALS — BP 118/72 | HR 70 | Temp 98.9°F | Ht 70.0 in | Wt 200.2 lb

## 2021-10-19 DIAGNOSIS — R55 Syncope and collapse: Secondary | ICD-10-CM | POA: Diagnosis not present

## 2021-10-19 MED ORDER — FELODIPINE ER 5 MG PO TB24: 5.0000 mg | ORAL_TABLET | Freq: Every day | ORAL | 1 refills | Status: DC

## 2021-10-19 NOTE — Telephone Encounter (Signed)
Dr Charlestine Night I saw our mutual patient today for one syncopal episode which I suspect vasovagal, exacerbated by orthostatic hypotension,dehydration. No associated CP.   EKG today shows SR however I noted new T wave inversion lead V2. Is this significant? Reviewed prior ekgs and could find similar for this lead  I advised patient to have f/u with you and sarah, cma, will call your office to discuss episode and if further evaluation ( holter monitor?) appropriate  Sarah, please call to sch appt for pt with Dr Charlestine Night or first available NP/PA.

## 2021-10-19 NOTE — Assessment & Plan Note (Addendum)
One episode. No recurrence. Systolic blood pressure dropped 18 pts from sitting standing. Borderline orthostatic hypotension. Presentation most consistent with vasovagal syncope exacerbated by orthostatic hypotension and dehydration.   EKG showed sinus rhythm.  Prior T wave inversions remain present however lead V2 shows new T wave inversion.  Reviewed prior EKG from 03/08/2021 as well as 12/09/2020 without T wave inversion in lead v2. I am sending note to Dr Charlestine Night regarding significance of t wave inversion as well as arranged follow-up with cardiology for discussion of syncope, Holter monitor.  Preemptively we agreed to reduced felodipine from 10 mg to 5 mg while monitoring BP.  She will continue telmisartan 80 mg .counseled on adequate hydration, limiting caffeine.  Emphasized the importance of vigilance and recurrence of symptoms which would warrant emergency room

## 2021-10-19 NOTE — Telephone Encounter (Signed)
LM to let patient know that I was able to schedule her with Cadence Furth. PA at [facility] 10/25/21 at 1:30p. I asked that she call back to please let me know that she received the message.

## 2021-10-19 NOTE — Progress Notes (Signed)
Subjective:    Patient ID: Danielle Humphrey, female    DOB: Jun 15, 1961, 61 y.o.   MRN: [id number]  CC: Danielle Humphrey is a 61 y.o. female who presents today for an acute visit.    HPI: Acute visit for syncopal episode x 6 days ago.  No recurrence of syncope  She had chicken salad sandwich for dinner, drank one glass of wine. After sitting for one hour or so,  she got up to go to the kitchen.    Upon standing, she felt a lightheaded sensation and she felt that she 'would pass out' and laid her head on the counter while sitting on barstool. She fell off stool and not sure if husband lowered her to the floor. No head injury and husband didn't recall that she hit her head. She was not confused at this time.  She had LOC and husband reports a couple of minutes. Husband found her on the kitchen floor.  After the woke up, she couldn't get off the floor as head was 'still spinning'. After an hour of rest and drinking water, she was able to get up with symptoms resolved.   She drank 6 cups of new caffeinated tea earlier that day. 1-2 cups of coffee that morning. Usually drinks 16-32 ounce of water and that day had had less .Notes that she typically has one glass of wine each night.  No associated exertional chest pain or pressure, numbness or tingling radiating to left arm or jaw, palpitations, frequent headaches, changes in vision, or shortness of breath.    She is not on anticoagulation No h/o arrhythmia, seizure.     History of hypertension and she is compliant with felodipine 10 mg, telmisartan 80 mg  History of anxiety she is compliant with Zoloft 100 mg, alprazolam 0.5 mg as needed  Mild to moderate mitral regurgitation Consult cardiology 03/07/2021 Echocardiogram 12/31/2020 showed normal systolic function, impaired relaxation, mild to moderate mitral valve regurgitation. HISTORY:  Past Medical History:  Diagnosis Date   Chickenpox 1960   Hypercholesteremia 2017   Hypertension 1980   Mitral  valve prolapse 2016   Shingles 2016   Past Surgical History:  Procedure Laterality Date   AMPUTATION FINGER Left 1981   third finger    APPENDECTOMY Bilateral 1968   BREAST EXCISIONAL BIOPSY Bilateral 2005   benign   BREAST LUMPECTOMY Bilateral 2005   COLONOSCOPY WITH PROPOFOL N/A 12/12/2019   Procedure: COLONOSCOPY WITH PROPOFOL;  Surgeon: Lin Landsman, MD;  Location: [facility];  Service: Gastroenterology;  Laterality: N/A;   Family History  Problem Relation Age of Onset   Breast cancer Mother 60       2005   Dementia Mother    Stroke Mother    Lupus Mother    Hypercholesterolemia Father    Heart attack Father        76   Hypertension Father    Diabetes type II Sister    Heart disease Brother    Ovarian cancer Neg Hx     Allergies: Codeine and Methotrexate derivatives Current Outpatient Medications on File Prior to Visit  Medication Sig Dispense Refill   ALPRAZolam (XANAX) 0.5 MG tablet Take 1 tablet (0.5 mg total) by mouth daily as needed for anxiety. 30 tablet 1   rosuvastatin (CRESTOR) 20 MG tablet Take one tablet (20 MG) PO qpm 5 days per week. 60 tablet 2   sertraline (ZOLOFT) 100 MG tablet TAKE 1 TABLET BY MOUTH EVERYDAY AT BEDTIME 90  tablet 5   telmisartan (MICARDIS) 80 MG tablet TAKE 1 TABLET BY MOUTH EVERY DAY 30 tablet 8   VITAMIN D, CHOLECALCIFEROL, PO Take 800 Units by mouth daily.     No current facility-administered medications on file prior to visit.    Social History   Tobacco Use   Smoking status: Never   Smokeless tobacco: Never  Vaping Use   Vaping Use: Never used  Substance Use Topics   Alcohol use: Yes    Alcohol/week: 7.0 standard drinks    Types: 3 Glasses of wine, 4 Cans of beer per week    Comment: bourbon and diet ginger ale, red wine 3 x per week.   Drug use: No    Review of Systems  Constitutional:  Negative for chills and fever.  Respiratory:  Negative for cough.   Cardiovascular:  Negative for chest pain and  palpitations.  Gastrointestinal:  Negative for nausea and vomiting.  Neurological:  Positive for syncope. Negative for dizziness (resolved) and headaches.     Objective:    BP 118/72    Pulse 70    Temp 98.9 F (37.2 C) (Oral)    Ht 5\' 10"  (1.778 m)    Wt 200 lb 3.2 oz (90.8 kg)    SpO2 98%    BMI 28.73 kg/m  BP Readings from Last 3 Encounters:  10/19/21 118/72  03/07/21 132/80  02/09/21 122/78   Wt Readings from Last 3 Encounters:  10/19/21 200 lb 3.2 oz (90.8 kg)  03/07/21 199 lb (90.3 kg)  02/09/21 199 lb (90.3 kg)     Physical Exam Vitals reviewed.  Constitutional:      Appearance: She is well-developed.  Eyes:     Conjunctiva/sclera: Conjunctivae normal.  Cardiovascular:     Rate and Rhythm: Normal rate and regular rhythm.     Pulses: Normal pulses.     Heart sounds: Normal heart sounds.  Pulmonary:     Effort: Pulmonary effort is normal.     Breath sounds: Normal breath sounds. No wheezing, rhonchi or rales.  Musculoskeletal:     Right lower leg: No edema.     Left lower leg: No edema.  Skin:    General: Skin is warm and dry.  Neurological:     Mental Status: She is alert.  Psychiatric:        Speech: Speech normal.        Behavior: Behavior normal.        Thought Content: Thought content normal.       Assessment & Plan:   Problem List Items Addressed This Visit       Other   Syncope - Primary    One episode. No recurrence. Systolic blood pressure dropped 18 pts from sitting standing. Borderline orthostatic hypotension. Presentation most consistent with vasovagal syncope exacerbated by orthostatic hypotension and dehydration.   EKG showed sinus rhythm.  Prior T wave inversions remain present however lead V2 shows new T wave inversion.  Reviewed prior EKG from 03/08/2021 as well as 12/09/2020 without T wave inversion in lead v2. I am sending note to Dr Charlestine Night regarding significance of t wave inversion as well as arranged follow-up with cardiology for  discussion of syncope, Holter monitor.  Preemptively we agreed to reduced felodipine from 10 mg to 5 mg while monitoring BP.  She will continue telmisartan 80 mg .counseled on adequate hydration, limiting caffeine.  Emphasized the importance of vigilance and recurrence of symptoms which would warrant emergency room  Relevant Medications   felodipine (PLENDIL) 5 MG 24 hr tablet   Other Relevant Orders   EKG 12-Lead (Completed)   CBC with Differential/Platelet   Comprehensive metabolic panel   Hemoglobin A1c      I have discontinued Nihira W. Basinski's felodipine. I am also having her start on felodipine. Additionally, I am having her maintain her (VITAMIN D, CHOLECALCIFEROL, PO), ALPRAZolam, sertraline, rosuvastatin, and telmisartan.   Meds ordered this encounter  Medications   felodipine (PLENDIL) 5 MG 24 hr tablet    Sig: Take 1 tablet (5 mg total) by mouth daily.    Dispense:  90 tablet    Refill:  1    Order Specific Question:   Supervising Provider    Answer:   Crecencio Mc [[id number]]    Return precautions given.   Risks, benefits, and alternatives of the medications and treatment plan prescribed today were discussed, and patient expressed understanding.   Education regarding symptom management and diagnosis given to patient on AVS.  Continue to follow with Burnard Hawthorne, FNP for routine health maintenance.   Danielle Humphrey and I agreed with plan.   Mable Paris, FNP

## 2021-10-19 NOTE — Patient Instructions (Addendum)
As discussed, I suspect you had a vasovagal symptom syncopal episode likely exacerbated by dehydration and standing too quickly.  Please be very careful as you go from sitting to standing and ensure that you are well-hydrated, limiting caffeine.  Proactively, we will go ahead and decrease felodipine from 10mg  to 5mg  while a consult with cardiology and arrange a follow-up appointment to discuss further evaluation and Holter monitor.  Please stay extremely vigilant as it relates to dizziness, recurrent syncopal episode, associated chest pain or shortness of breath, palpitations, all of which would warrant immediate evaluation in the emergency room  Orthostatic Hypotension Blood pressure is a measurement of how strongly, or weakly, your circulating blood is pressing against the walls of your arteries. Orthostatic hypotension is a drop in blood pressure that can happen when you change positions, such as when you go from lying down to standing. Arteries are blood vessels that carry blood from your heart throughout your body. When blood pressure is too low, you may not get enough blood to your brain or to the rest of your organs. Orthostatic hypotension can cause light-headedness, sweating, rapid heartbeat, blurred vision, and fainting. These symptoms require further investigation into the cause. What are the causes? Orthostatic hypotension can be caused by many things, including: Sudden changes in posture, such as standing up quickly after you have been sitting or lying down. Loss of blood (anemia) or loss of body fluids (dehydration). Heart problems, neurologic problems, or hormone problems. Pregnancy. Aging. The risk for this condition increases as you get older. Severe infection (sepsis). Certain medicines, such as medicines for high blood pressure or medicines that make the body lose excess fluids (diuretics). What are the signs or symptoms? Symptoms of this condition may include: Weakness,  light-headedness, or dizziness. Sweating. Blurred vision. Tiredness (fatigue). Rapid heartbeat. Fainting, in severe cases. How is this diagnosed? This condition is diagnosed based on: Your symptoms and medical history. Your blood pressure measurements. Your health care provider will check your blood pressure when you are: Lying down. Sitting. Standing. A blood pressure reading is recorded as two numbers, such as "120 over 80" (or 120/80). The first ("top") number is called the systolic pressure. It is a measure of the pressure in your arteries as your heart beats. The second ("bottom") number is called the diastolic pressure. It is a measure of the pressure in your arteries when your heart relaxes between beats. Blood pressure is measured in a unit called mmHg. Healthy blood pressure for most adults is 120/80 mmHg. Orthostatic hypotension is defined as a 20 mmHg drop in systolic pressure or a 10 mmHg drop in diastolic pressure within 3 minutes of standing. Other information or tests that may be used to diagnose orthostatic hypotension include: Your other vital signs, such as your heart rate and temperature. Blood tests. An electrocardiogram (ECG) or echocardiogram. A Holter monitor. This is a device you wear that records your heart rhythm continuously, usually for 24-48 hours. Tilt table test. For this test, you will be safely secured to a table that moves you from a lying position to an upright position. Your heart rhythm and blood pressure will be monitored during the test. How is this treated? This condition may be treated by: Changing your diet. This may involve eating more salt (sodium) or drinking more water. Changing the dosage of certain medicines you are taking that might be lowering your blood pressure. Correcting the underlying reason for the orthostatic hypotension. Wearing compression stockings. Taking medicines to raise your blood  pressure. Avoiding actions that trigger  symptoms. Follow these instructions at home: Medicines Take over-the-counter and prescription medicines only as told by your health care provider. Follow instructions from your health care provider about changing the dosage of your current medicines, if this applies. Do not stop or adjust any of your medicines on your own. Eating and drinking  Drink enough fluid to keep your urine pale yellow. Eat extra salt only as directed. Do not add extra salt to your diet unless advised by your health care provider. Eat frequent, small meals. Avoid standing up suddenly after eating. General instructions  Get up slowly from lying down or sitting positions. This gives your blood pressure a chance to adjust. Avoid hot showers and excessive heat as directed by your health care provider. Engage in regular physical activity as directed by your health care provider. If you have compression stockings, wear them as told. Keep all follow-up visits. This is important. Contact a health care provider if: You have a fever for more than 2-3 days. You feel more thirsty than usual. You feel dizzy or weak. Get help right away if: You have chest pain. You have a fast or irregular heartbeat. You become sweaty or feel light-headed. You feel short of breath. You faint. You have any symptoms of a stroke. "BE FAST" is an easy way to remember the main warning signs of a stroke: B - Balance. Signs are dizziness, sudden trouble walking, or loss of balance. E - Eyes. Signs are trouble seeing or a sudden change in vision. F - Face. Signs are sudden weakness or numbness of the face, or the face or eyelid drooping on one side. A - Arms. Signs are weakness or numbness in an arm. This happens suddenly and usually on one side of the body. S - Speech. Signs are sudden trouble speaking, slurred speech, or trouble understanding what people say. T - Time. Time to call emergency services. Write down what time symptoms started. You  have other signs of a stroke, such as: A sudden, severe headache with no known cause. Nausea or vomiting. Seizure. These symptoms may represent a serious problem that is an emergency. Do not wait to see if the symptoms will go away. Get medical help right away. Call your local emergency services (911 in the [place] Do not drive yourself to the hospital. Summary Orthostatic hypotension is a sudden drop in blood pressure. It can cause light-headedness, sweating, rapid heartbeat, blurred vision, and fainting. Orthostatic hypotension can be diagnosed by having your blood pressure taken while lying down, sitting, and then standing. Treatment may involve changing your diet, wearing compression stockings, sitting up slowly, adjusting your medicines, or correcting the underlying reason for the orthostatic hypotension. Get help right away if you have chest pain, a fast or irregular heartbeat, or symptoms of a stroke. This information is not intended to replace advice given to you by your health care provider. Make sure you discuss any questions you have with your health care provider. Document Revised: 11/25/2020 Document Reviewed: 11/25/2020 Elsevier Patient Education  [place]. Dizziness Dizziness is a common problem. It is a feeling of unsteadiness or light-headedness. You may feel like you are about to faint. Dizziness can lead to injury if you stumble or fall. Anyone can become dizzy, but dizziness is more common in older adults. This condition can be caused by a number of things, including medicines, dehydration, or illness. Follow these instructions at home: Eating and drinking  Drink enough fluid to  keep your urine pale yellow. This helps to keep you from becoming dehydrated. Try to drink more clear fluids, such as water. Do not drink alcohol. Limit your caffeine intake if told to do so by your health care provider. Check ingredients and nutrition facts to see if a food or beverage  contains caffeine. Limit your salt (sodium) intake if told to do so by your health care provider. Check ingredients and nutrition facts to see if a food or beverage contains sodium. Activity  Avoid making quick movements. Rise slowly from chairs and steady yourself until you feel okay. In the morning, first sit up on the side of the bed. When you feel okay, stand slowly while you hold onto something until you know that your balance is good. If you need to stand in one place for a long time, move your legs often. Tighten and relax the muscles in your legs while you are standing. Do not drive or use machinery if you feel dizzy. Avoid bending down if you feel dizzy. Place items in your home so that they are easy for you to reach without leaning over. Lifestyle Do not use any products that contain nicotine or tobacco. These products include cigarettes, chewing tobacco, and vaping devices, such as e-cigarettes. If you need help quitting, ask your health care provider. Try to reduce your stress level by using methods such as yoga or meditation. Talk with your health care provider if you need help to manage your stress. General instructions Watch your dizziness for any changes. Take over-the-counter and prescription medicines only as told by your health care provider. Talk with your health care provider if you think that your dizziness is caused by a medicine that you are taking. Tell a friend or a family member that you are feeling dizzy. If he or she notices any changes in your behavior, have this person call your health care provider. Keep all follow-up visits. This is important. Contact a health care provider if: Your dizziness does not go away or you have new symptoms. Your dizziness or light-headedness gets worse. You feel nauseous. You have reduced hearing. You have a fever. You have neck pain or a stiff neck. Your dizziness leads to an injury or a fall. Get help right away if: You vomit  or have diarrhea and are unable to eat or drink anything. You have problems talking, walking, swallowing, or using your arms, hands, or legs. You feel generally weak. You have any bleeding. You are not thinking clearly or you have trouble forming sentences. It may take a friend or family member to notice this. You have chest pain, abdominal pain, shortness of breath, or sweating. Your vision changes or you develop a severe headache. These symptoms may represent a serious problem that is an emergency. Do not wait to see if the symptoms will go away. Get medical help right away. Call your local emergency services (911 in the [place] Do not drive yourself to the hospital. Summary Dizziness is a feeling of unsteadiness or light-headedness. This condition can be caused by a number of things, including medicines, dehydration, or illness. Anyone can become dizzy, but dizziness is more common in older adults. Drink enough fluid to keep your urine pale yellow. Do not drink alcohol. Avoid making quick movements if you feel dizzy. Monitor your dizziness for any changes. This information is not intended to replace advice given to you by your health care provider. Make sure you discuss any questions you have with your health  care provider. Document Revised: 08/16/2020 Document Reviewed: 08/16/2020 Elsevier Patient Education  2022 [employer].

## 2021-10-20 ENCOUNTER — Telehealth: Payer: Self-pay | Admitting: Family

## 2021-10-20 LAB — COMPREHENSIVE METABOLIC PANEL
ALT: 12 U/L (ref 0–35)
AST: 15 U/L (ref 0–37)
Albumin: 4.5 g/dL (ref 3.5–5.2)
Alkaline Phosphatase: 42 U/L (ref 39–117)
BUN: 17 mg/dL (ref 6–23)
CO2: 25 mEq/L (ref 19–32)
Calcium: 10 mg/dL (ref 8.4–10.5)
Chloride: 106 mEq/L (ref 96–112)
Creatinine, Ser: 0.89 mg/dL (ref 0.40–1.20)
GFR: 69.5 mL/min (ref >60.00)
Glucose, Bld: 99 mg/dL (ref 70–99)
Potassium: 3.9 mEq/L (ref 3.5–5.1)
Sodium: 139 mEq/L (ref 135–145)
Total Bilirubin: 0.6 mg/dL (ref 0.2–1.2)
Total Protein: 7.3 g/dL (ref 6.0–8.3)

## 2021-10-20 LAB — CBC WITH DIFFERENTIAL/PLATELET
Basophils Absolute: 0 10*3/uL (ref 0.0–0.1)
Basophils Relative: 0.3 % (ref 0.0–3.0)
Eosinophils Absolute: 0.1 10*3/uL (ref 0.0–0.7)
Eosinophils Relative: 0.8 % (ref 0.0–5.0)
HCT: 41.2 % (ref 36.0–46.0)
Hemoglobin: 13.5 g/dL (ref 12.0–15.0)
Lymphocytes Relative: 19.4 % (ref 12.0–46.0)
Lymphs Abs: 2.2 10*3/uL (ref 0.7–4.0)
MCHC: 32.7 g/dL (ref 30.0–36.0)
MCV: 90.5 fl (ref 78.0–100.0)
Monocytes Absolute: 0.8 10*3/uL (ref 0.1–1.0)
Monocytes Relative: 7.3 % (ref 3.0–12.0)
Neutro Abs: 8.3 10*3/uL — ABNORMAL HIGH (ref 1.4–7.7)
Neutrophils Relative %: 72.2 % (ref 43.0–77.0)
Platelets: 225 10*3/uL (ref 150.0–400.0)
RBC: 4.55 Mil/uL (ref 3.87–5.11)
RDW: 13.4 % (ref 11.5–15.5)
WBC: 11.4 10*3/uL — ABNORMAL HIGH (ref 4.0–10.5)

## 2021-10-20 LAB — HEMOGLOBIN A1C: Hgb A1c MFr Bld: 5.7 % (ref 4.6–6.5)

## 2021-10-20 NOTE — Telephone Encounter (Signed)
Noted  

## 2021-10-20 NOTE — Telephone Encounter (Signed)
Patient Called in stated she got Judson Roch message and she will keep appointment that was scheduled

## 2021-10-21 NOTE — Telephone Encounter (Signed)
Noted  Appt with Cadence furth 10/25/21

## 2021-10-25 ENCOUNTER — Encounter: Payer: Self-pay | Admitting: Medical

## 2021-10-25 ENCOUNTER — Ambulatory Visit: Payer: Self-pay

## 2021-10-25 ENCOUNTER — Ambulatory Visit (INDEPENDENT_AMBULATORY_CARE_PROVIDER_SITE_OTHER): Payer: No Typology Code available for payment source | Admitting: Medical

## 2021-10-25 ENCOUNTER — Other Ambulatory Visit: Payer: Self-pay

## 2021-10-25 ENCOUNTER — Other Ambulatory Visit
Admission: RE | Admit: 2021-10-25 | Discharge: 2021-10-25 | Disposition: A | Discharge status: Home / Self Care | Payer: No Typology Code available for payment source | Source: Ambulatory Visit | Attending: Medical | Admitting: Medical

## 2021-10-25 VITALS — BP 138/84 | HR 66 | Ht 70.0 in | Wt 199.0 lb

## 2021-10-25 DIAGNOSIS — I340 Nonrheumatic mitral (valve) insufficiency: Secondary | ICD-10-CM | POA: Diagnosis not present

## 2021-10-25 DIAGNOSIS — R55 Syncope and collapse: Secondary | ICD-10-CM | POA: Diagnosis present

## 2021-10-25 DIAGNOSIS — I10 Essential (primary) hypertension: Secondary | ICD-10-CM

## 2021-10-25 LAB — MAGNESIUM: Magnesium: 2.4 mg/dL (ref 1.7–2.4)

## 2021-10-25 LAB — TSH: TSH: 6.197 u[IU]/mL — ABNORMAL HIGH (ref 0.350–4.500)

## 2021-10-25 NOTE — Patient Instructions (Signed)
Medication Instructions:  Your physician recommends that you continue on your current medications as directed. Please refer to the Current Medication list given to you today.   *If you need a refill on your cardiac medications before your next appointment, please call your pharmacy*   Lab Work:  Your provider has ordered labs (magnesium, TSH). Please go to the medical mall to have these drawn.    Medical Mall Entrance at [facility] 1st desk on the right to check in, past the screening table Lab hours: Monday- Friday (7:30 am- 5:30 pm)   If you have labs (blood work) drawn today and your tests are completely normal, you will receive your results only by: [place] (if you have MyChart) OR A paper copy in the mail If you have any lab test that is abnormal or we need to change your treatment, we will call you to review the results.   Testing/Procedures:  Echocardiogram - Your physician has requested that you have an echocardiogram in 3 weeks. Echocardiography is a painless test that uses sound waves to create images of your heart. It provides your doctor with information about the size and shape of your heart and how well your hearts chambers and valves are working. This procedure takes approximately one hour. There are no restrictions for this procedure.   2. Your provider has ordered a heart monitor to wear for 14 days. This will be mailed to your home with instructions on placement. Once you have finished the time frame requested, you will return monitor in box provided.     Follow-Up: At [facility], you and your health needs are our priority.  As part of our continuing mission to provide you with exceptional heart care, we have created designated Provider Care Teams.  These Care Teams include your primary Cardiologist (physician) and Advanced Practice Providers (APPs -  Physician Assistants and Nurse Practitioners) who all work together to provide you with the care you need, when  you need it.  We recommend signing up for the patient portal called "MyChart".  Sign up information is provided on this After Visit Summary.  MyChart is used to connect with patients for Virtual Visits (Telemedicine).  Patients are able to view lab/test results, encounter notes, upcoming appointments, etc.  Non-urgent messages can be sent to your provider as well.   To learn more about what you can do with MyChart, go to [email or web address].    Your next appointment:   6-8 week(s)  The format for your next appointment:   In Person  Provider:   You may see Kate Sable, MD or one of the following Advanced Practice Providers on your designated Care Team:   Murray Hodgkins, NP Christell Faith, PA-C Cadence Kathlen Mody, PA-C:1}    Other Instructions N/A

## 2021-10-25 NOTE — Progress Notes (Signed)
Cardiology Office Note:    Date:  10/25/2021   ID:  Barbera, Perritt 08-Nov-1960, MRN [id number]  PCP:  Burnard Hawthorne, FNP  [facility] Cardiologist:  Kate Sable, MD  [facility] Electrophysiologist:  None   Referring MD: Burnard Hawthorne, FNP   Chief Complaint: F/u fainting  History of Present Illness:    Danielle Humphrey is a 61 y.o. female with a hx of with a history of mild to moderate MR, hypertension, hyperlipidemia who presents for follow-up.  Family history notable for coronary artery disease. She has been previously intolerant to statins, but tolerates Crestor 10mg  daily.  Echo 12/16/2019 showed normal systolic function, EF 61%, mild to moderate MR.  Last seen 03/07/2021 for follow-up on MR. Echo 12/31/2020 showed normal systolic function, EF 60%, mild to moderate MR.Plan was for repeat echo in a year.  Today, the patient reported an episode of LOC, this occurred 12 days ago. Saw PCP as well. IT occurred at night after dinner. Had a glass of wine with dinner. Got up to the kitchen and felt dizzy. She at on a stool and took deep breaths. Didn't go away and put head down. Called husband to say she didn't feel well. Remembers waking up on the floor. She fell between the wall and stool and didn't hit her head. Laid on the floor for an hour before dizziness subsided and she could get up and go to bed. Prior to fall no chest pain, SOB, numbness, tingling. Felt dizziness had quick onset. Takes BP meds in the AM. Did not call EMS since the patient woke-up. Prior LOC in the setting of codeine use. Has h/o dizziness. Has episodic dizziness, worse when standing.  No recent LLE, orthopnea, pnd.   Saw PCP 1/25 and felodipine 10 to 5 mg daily.   Past Medical History:  Diagnosis Date   Chickenpox 1960   Hypercholesteremia 2017   Hypertension 1980   Mitral valve prolapse 2016   Shingles 2016    Past Surgical History:  Procedure Laterality Date   AMPUTATION FINGER Left 1981    third finger    APPENDECTOMY Bilateral 1968   BREAST EXCISIONAL BIOPSY Bilateral 2005   benign   BREAST LUMPECTOMY Bilateral 2005   COLONOSCOPY WITH PROPOFOL N/A 12/12/2019   Procedure: COLONOSCOPY WITH PROPOFOL;  Surgeon: Lin Landsman, MD;  Location: [facility] ENDOSCOPY;  Service: Gastroenterology;  Laterality: N/A;    Current Medications: Current Meds  Medication Sig   ALPRAZolam (XANAX) 0.5 MG tablet Take 1 tablet (0.5 mg total) by mouth daily as needed for anxiety.   felodipine (PLENDIL) 5 MG 24 hr tablet Take 1 tablet (5 mg total) by mouth daily.   rosuvastatin (CRESTOR) 20 MG tablet Take one tablet (20 MG) PO qpm 5 days per week.   sertraline (ZOLOFT) 100 MG tablet TAKE 1 TABLET BY MOUTH EVERYDAY AT BEDTIME   telmisartan (MICARDIS) 80 MG tablet TAKE 1 TABLET BY MOUTH EVERY DAY   VITAMIN D, CHOLECALCIFEROL, PO Take 800 Units by mouth daily.     Allergies:   Codeine and Methotrexate derivatives   Social History   Socioeconomic History   Marital status: Married    Spouse name: Dariya Gainer   Number of children: 2   Years of education: 2   Highest education level: Not on file  Occupational History   Occupation: [employer]  Tobacco Use   Smoking status: Never   Smokeless tobacco: Never  Vaping Use   Vaping  Use: Never used  Substance and Sexual Activity   Alcohol use: Yes    Alcohol/week: 7.0 standard drinks    Types: 3 Glasses of wine, 4 Cans of beer per week    Comment: bourbon and diet ginger ale, red wine 3 x per week.   Drug use: No   Sexual activity: Yes  Other Topics Concern   Not on file  Social History Narrative   Lives in [place]      Married      2 children   Granddaughter- two of them      Work- Market researcher for Paper distribution   Social Determinants of Radio broadcast assistant Strain: Not on Art therapist Insecurity: Not on file  Transportation Needs: Not on file  Physical Activity: Not on file  Stress: Not on file   Social Connections: Not on file     Family History: The patient's family history includes Breast cancer (age of onset: 18) in her mother; Dementia in her mother; Diabetes type II in her sister; Heart attack in her father; Heart disease in her brother; Hypercholesterolemia in her father; Hypertension in her father; Lupus in her mother; Stroke in her mother. There is no history of Ovarian cancer.  ROS:   Please see the history of present illness.     All other systems reviewed and are negative.  EKGs/Labs/Other Studies Reviewed:    The following studies were reviewed today:  Echo 12/31/20  1. Left ventricular ejection fraction, by estimation, is 60 to 65%. The  left ventricle has normal function. The left ventricle has no regional  wall motion abnormalities. Left ventricular diastolic parameters are  consistent with Grade I diastolic  dysfunction (impaired relaxation).   2. Right ventricular systolic function is normal. The right ventricular  size is normal.   3. Left atrial size was moderately dilated.   4. The mitral valve is normal in structure. Mild to moderate mitral valve  regurgitation. Unable to exclude prolpase of the posterior leaflet, not  well visualized. No evidence of mitral stenosis.   EKG:  EKG is  ordered today.  The ekg ordered today demonstrates Nsr, 66bpm, nonspecific ST changes  Recent Labs: 12/08/2020: TSH 3.450 10/19/2021: ALT 12; BUN 17; Creatinine, Ser 0.89; Hemoglobin 13.5; Platelets 225.0; Potassium 3.9; Sodium 139  Recent Lipid Panel    Component Value Date/Time   CHOL 199 02/09/2021 0859   TRIG 115.0 02/09/2021 0859   HDL 51.70 02/09/2021 0859   CHOLHDL 4 02/09/2021 0859   VLDL 23.0 02/09/2021 0859   LDLCALC 124 (H) 02/09/2021 0859   LDLDIRECT 109.0 08/09/2020 0826    Physical Exam:    VS:  BP 138/84 (BP Location: Left Arm, Patient Position: Sitting, Cuff Size: Normal)    Pulse 66    Ht 5\' 10"  (1.778 m)    Wt 199 lb (90.3 kg)    SpO2 98%    BMI  28.55 kg/m     Wt Readings from Last 3 Encounters:  10/25/21 199 lb (90.3 kg)  10/19/21 200 lb 3.2 oz (90.8 kg)  03/07/21 199 lb (90.3 kg)     GEN:  Well nourished, well developed in no acute distress HEENT: Normal NECK: No JVD; No carotid bruits LYMPHATICS: No lymphadenopathy CARDIAC: RRR, no murmurs, rubs, gallops RESPIRATORY:  Clear to auscultation without rales, wheezing or rhonchi  ABDOMEN: Soft, non-tender, non-distended MUSCULOSKELETAL:  No edema; No deformity  SKIN: Warm and dry NEUROLOGIC:  Alert and oriented x 3 PSYCHIATRIC:  Normal affect   ASSESSMENT:    1. Syncope, unspecified syncope type   2. Mitral valve insufficiency, unspecified etiology   3. Essential hypertension    PLAN:    In order of problems listed above:  Syncope Syncopal episode sounds orthostatic however cannot r/u other etiology. PCP decreased Felodipine from 10 to 5 mg daily. No further syncope, but has chronic dizziness with position change. Orthostatics today negative. EKG with NSR and no arrhythmia. Recent labs reviewed. I will check TSH and Mag. I will order an echo and 2 week heart monitor. Also discussed compression socks, abdominal binder, liberalize salt intake. No carotid bruit on exam.  Mitral valve Dz Echo 12/2020 showed normal EF at 60%, mild to mod MR. Repeat echo as above.   HTN Felodipine recently decreased in the setting of syncope. For this reason BP mildly elevated. No changes in medications today.   Disposition: Follow up in 6-8 week(s) with MD/APP    Signed, Jazen Spraggins Ninfa Meeker, PA-C  10/25/2021 2:57 PM    [facility]

## 2021-11-04 NOTE — Addendum Note (Signed)
Addended by: Ronaldo Miyamoto on: 11/04/2021 09:12 AM   Modules accepted: Orders

## 2021-11-18 ENCOUNTER — Ambulatory Visit (INDEPENDENT_AMBULATORY_CARE_PROVIDER_SITE_OTHER): Payer: No Typology Code available for payment source

## 2021-11-18 ENCOUNTER — Other Ambulatory Visit: Payer: Self-pay

## 2021-11-18 DIAGNOSIS — I340 Nonrheumatic mitral (valve) insufficiency: Secondary | ICD-10-CM | POA: Diagnosis not present

## 2021-11-18 LAB — ECHOCARDIOGRAM COMPLETE
AR max vel: 3.94 cm2
AV Area VTI: 3.65 cm2
AV Area mean vel: 3.23 cm2
AV Mean grad: 3 mmHg
AV Peak grad: 4.8 mmHg
Ao pk vel: 1.1 m/s
Area-P 1/2: 2.33 cm2
Calc EF: 67.3 %
MV M vel: 5.54 m/s
MV Peak grad: 122.8 mmHg
S' Lateral: 3.2 cm
Single Plane A2C EF: 59.1 %
Single Plane A4C EF: 72.6 %

## 2021-11-21 ENCOUNTER — Ambulatory Visit: Payer: No Typology Code available for payment source | Admitting: Family

## 2021-11-21 ENCOUNTER — Other Ambulatory Visit: Payer: Self-pay

## 2021-11-21 ENCOUNTER — Encounter: Payer: Self-pay | Admitting: Family

## 2021-11-21 VITALS — BP 130/76 | HR 74 | Temp 98.7°F | Ht 68.0 in | Wt 199.2 lb

## 2021-11-21 DIAGNOSIS — R899 Unspecified abnormal finding in specimens from other organs, systems and tissues: Secondary | ICD-10-CM | POA: Diagnosis not present

## 2021-11-21 DIAGNOSIS — F411 Generalized anxiety disorder: Secondary | ICD-10-CM

## 2021-11-21 DIAGNOSIS — R55 Syncope and collapse: Secondary | ICD-10-CM

## 2021-11-21 DIAGNOSIS — I10 Essential (primary) hypertension: Secondary | ICD-10-CM

## 2021-11-21 LAB — CBC WITH DIFFERENTIAL/PLATELET
Basophils Absolute: 0 10*3/uL (ref 0.0–0.1)
Basophils Relative: 0.4 % (ref 0.0–3.0)
Eosinophils Absolute: 0.2 10*3/uL (ref 0.0–0.7)
Eosinophils Relative: 2.2 % (ref 0.0–5.0)
HCT: 39.1 % (ref 36.0–46.0)
Hemoglobin: 13.2 g/dL (ref 12.0–15.0)
Lymphocytes Relative: 33.8 % (ref 12.0–46.0)
Lymphs Abs: 2.7 10*3/uL (ref 0.7–4.0)
MCHC: 33.6 g/dL (ref 30.0–36.0)
MCV: 89.5 fl (ref 78.0–100.0)
Monocytes Absolute: 0.5 10*3/uL (ref 0.1–1.0)
Monocytes Relative: 5.9 % (ref 3.0–12.0)
Neutro Abs: 4.6 10*3/uL (ref 1.4–7.7)
Neutrophils Relative %: 57.7 % (ref 43.0–77.0)
Platelets: 224 10*3/uL (ref 150.0–400.0)
RBC: 4.37 Mil/uL (ref 3.87–5.11)
RDW: 12.9 % (ref 11.5–15.5)
WBC: 8 10*3/uL (ref 4.0–10.5)

## 2021-11-21 NOTE — Assessment & Plan Note (Signed)
Increased over the past week with family circumstances. We agreed not to make  Medication changes today and she will let me know how she is doing. Continue zoloft 100mg , xanax 0.5mg  prn.

## 2021-11-21 NOTE — Progress Notes (Signed)
Subjective:    Patient ID: Danielle Humphrey, female    DOB: 08-01-1961, 61 y.o.   MRN: [id number]  CC: Danielle Humphrey is a 61 y.o. female who presents today for follow up.   HPI: Family stress as husband as away as brother in law has been sick. Increased stress for the past weeks and feels situational. She is compliant with zoloft 100mg .  No further syncopal episodes. She has continued dizziness when standing occurring once per day, episodes are less severe since decreasing felodipine. No cp, palpitations, ha  Elevated TSH recently. She is not on biotin  Consult with cardiology 10/25/2021.  She is tolerating decreased felodipine 5 mg daily.  echocardiogram  11/18/21 showed left ventricular ejection fraction 60 to 07%, grade 1 diastolic dysfunction.  2-week heart monitor ( no results in Epic)  No result of sleep study as ordered by cardiology 12/2020  HISTORY:  Past Medical History:  Diagnosis Date   Chickenpox 1960   Hypercholesteremia 2017   Hypertension 1980   Mitral valve prolapse 2016   Shingles 2016   Past Surgical History:  Procedure Laterality Date   AMPUTATION FINGER Left 1981   third finger    APPENDECTOMY Bilateral 1968   BREAST EXCISIONAL BIOPSY Bilateral 2005   benign   BREAST LUMPECTOMY Bilateral 2005   COLONOSCOPY WITH PROPOFOL N/A 12/12/2019   Procedure: COLONOSCOPY WITH PROPOFOL;  Surgeon: Lin Landsman, MD;  Location: [facility];  Service: Gastroenterology;  Laterality: N/A;   Family History  Problem Relation Age of Onset   Breast cancer Mother 60       2005   Dementia Mother    Stroke Mother    Lupus Mother    Hypercholesterolemia Father    Heart attack Father        55   Hypertension Father    Diabetes type II Sister    Heart disease Brother    Ovarian cancer Neg Hx     Allergies: Codeine and Methotrexate derivatives Current Outpatient Medications on File Prior to Visit  Medication Sig Dispense Refill   ALPRAZolam (XANAX) 0.5 MG tablet  Take 1 tablet (0.5 mg total) by mouth daily as needed for anxiety. 30 tablet 1   felodipine (PLENDIL) 5 MG 24 hr tablet Take 1 tablet (5 mg total) by mouth daily. 90 tablet 1   rosuvastatin (CRESTOR) 20 MG tablet Take one tablet (20 MG) PO qpm 5 days per week. 60 tablet 2   sertraline (ZOLOFT) 100 MG tablet TAKE 1 TABLET BY MOUTH EVERYDAY AT BEDTIME 90 tablet 5   telmisartan (MICARDIS) 80 MG tablet TAKE 1 TABLET BY MOUTH EVERY DAY 30 tablet 8   VITAMIN D, CHOLECALCIFEROL, PO Take 800 Units by mouth daily.     No current facility-administered medications on file prior to visit.    Social History   Tobacco Use   Smoking status: Never   Smokeless tobacco: Never  Vaping Use   Vaping Use: Never used  Substance Use Topics   Alcohol use: Yes    Alcohol/week: 7.0 standard drinks    Types: 3 Glasses of wine, 4 Cans of beer per week    Comment: bourbon and diet ginger ale, red wine 3 x per week.   Drug use: No    Review of Systems  Constitutional:  Negative for chills and fever.  Respiratory:  Negative for cough.   Cardiovascular:  Negative for chest pain and palpitations.  Gastrointestinal:  Negative for nausea and vomiting.  Objective:    BP 130/76 (BP Location: Left Arm, Patient Position: Sitting, Cuff Size: Normal)    Pulse 74    Temp 98.7 F (37.1 C) (Oral)    Ht 5\' 8"  (1.727 m)    Wt 199 lb 3.2 oz (90.4 kg)    SpO2 97%    BMI 30.29 kg/m  BP Readings from Last 3 Encounters:  11/21/21 130/76  10/25/21 138/84  10/19/21 118/72   Wt Readings from Last 3 Encounters:  11/21/21 199 lb 3.2 oz (90.4 kg)  10/25/21 199 lb (90.3 kg)  10/19/21 200 lb 3.2 oz (90.8 kg)    Physical Exam Vitals reviewed.  Constitutional:      Appearance: She is well-developed.  Eyes:     Conjunctiva/sclera: Conjunctivae normal.  Cardiovascular:     Rate and Rhythm: Normal rate and regular rhythm.     Pulses: Normal pulses.     Heart sounds: Normal heart sounds.  Pulmonary:     Effort: Pulmonary  effort is normal.     Breath sounds: Normal breath sounds. No wheezing, rhonchi or rales.  Skin:    General: Skin is warm and dry.  Neurological:     Mental Status: She is alert.  Psychiatric:        Speech: Speech normal.        Behavior: Behavior normal.        Thought Content: Thought content normal.       Assessment & Plan:   Problem List Items Addressed This Visit       Cardiovascular and Mediastinum   HTN (hypertension)    Dizziness has improved in severity with decreased of felodipine to 5mg . We agreed that BP goal for her is < 130/80 versus 120/80. Continue felodipine 5mg         Other   Generalized anxiety disorder    Increased over the past week with family circumstances. We agreed not to make  Medication changes today and she will let me know how she is doing. Continue zoloft 100mg , xanax 0.5mg  prn.       Syncope    No recurrence. Pending results of holter monitor. She has follow up with Dr Charlestine Night; will follow. Advised consideration of completion of sleep study as ordered by cardiology last year. She will consider this and let me know.       Other Visit Diagnoses     Abnormal laboratory test    -  Primary   Relevant Orders   T3, free   T4, free   TSH   CBC with Differential/Platelet   VITAMIN D 25 Hydroxy (Vit-D Deficiency, Fractures)        I am having Danielle Humphrey maintain her (VITAMIN D, CHOLECALCIFEROL, PO), ALPRAZolam, sertraline, rosuvastatin, telmisartan, and felodipine.   No orders of the defined types were placed in this encounter.   Return precautions given.   Risks, benefits, and alternatives of the medications and treatment plan prescribed today were discussed, and patient expressed understanding.   Education regarding symptom management and diagnosis given to patient on AVS.  Continue to follow with Burnard Hawthorne, FNP for routine health maintenance.   Danielle Humphrey and I agreed with plan.   Mable Paris, FNP

## 2021-11-21 NOTE — Assessment & Plan Note (Signed)
No recurrence. Pending results of holter monitor. She has follow up with Dr Charlestine Night; will follow. Advised consideration of completion of sleep study as ordered by cardiology last year. She will consider this and let me know.

## 2021-11-21 NOTE — Assessment & Plan Note (Signed)
Dizziness has improved in severity with decreased of felodipine to 5mg . We agreed that BP goal for her is < 130/80 versus 120/80. Continue felodipine 5mg 

## 2021-11-21 NOTE — Patient Instructions (Signed)
Continue to consider sleep study

## 2021-11-22 LAB — T3, FREE: T3, Free: 3.1 pg/mL (ref 2.3–4.2)

## 2021-11-22 LAB — VITAMIN D 25 HYDROXY (VIT D DEFICIENCY, FRACTURES): VITD: 28.51 ng/mL — ABNORMAL LOW (ref 30.00–100.00)

## 2021-11-22 LAB — TSH: TSH: 3.92 u[IU]/mL (ref 0.35–5.50)

## 2021-11-22 LAB — T4, FREE: Free T4: 0.71 ng/dL (ref 0.60–1.60)

## 2021-12-12 ENCOUNTER — Other Ambulatory Visit: Payer: Self-pay | Admitting: Family

## 2021-12-12 DIAGNOSIS — E7800 Pure hypercholesterolemia, unspecified: Secondary | ICD-10-CM

## 2021-12-26 ENCOUNTER — Telehealth: Payer: Self-pay

## 2021-12-26 ENCOUNTER — Ambulatory Visit (INDEPENDENT_AMBULATORY_CARE_PROVIDER_SITE_OTHER): Payer: No Typology Code available for payment source | Admitting: Cardiology

## 2021-12-26 ENCOUNTER — Encounter: Payer: Self-pay | Admitting: Cardiology

## 2021-12-26 VITALS — BP 140/82 | HR 70 | Ht 70.5 in | Wt 199.0 lb

## 2021-12-26 DIAGNOSIS — R42 Dizziness and giddiness: Secondary | ICD-10-CM | POA: Diagnosis not present

## 2021-12-26 DIAGNOSIS — I10 Essential (primary) hypertension: Secondary | ICD-10-CM

## 2021-12-26 DIAGNOSIS — I340 Nonrheumatic mitral (valve) insufficiency: Secondary | ICD-10-CM

## 2021-12-26 DIAGNOSIS — E782 Mixed hyperlipidemia: Secondary | ICD-10-CM | POA: Diagnosis not present

## 2021-12-26 NOTE — Progress Notes (Signed)
?Cardiology Office Note:   ? ?Date:  12/26/2021  ? ?ID:  Danielle Humphrey, DOB 1961-02-06, MRN [id number] ? ?PCP:  Danielle Hawthorne, FNP  ?Cardiologist:  Danielle Sable, MD  ?Electrophysiologist:  None  ? ?Referring MD: Danielle Hawthorne, FNP  ? ?Chief Complaint  ?Patient presents with  ? OTher  ?  8 week follow up. Meds reviewed verbally with patient.   ? ? ?History of Present Illness:   ? ?Danielle Humphrey is a 61 y.o. female with a hx of mild to moderate MR, hypertension, hyperlipidemia who presents for follow-up.   ? ?Previously seen due to dizziness, and syncopal episode.  Echo and cardiac monitor was placed to evaluate any significant arrhythmias.  Admits to being dehydrated on the day of syncope.  She has not had any further episodes since.  Symptoms appear orthostatic with borderline orthostatic hypotension at PCPs office, felodipine was decreased from 10 to 5 mg daily.  She states feeling better with decreased dose of amlodipine.  Systolic blood pressure running in the 130s.  Cardiac monitor was mailed by patient, but not received by device company. ? ? ?Prior notes ?Echo 02/27/9934, normal systolic function, EF 70% mild to moderate MR ?Echocardiogram 1/77/9390 normal systolic function, EF 30% mild to moderate MR ? ? ?Past Medical History:  ?Diagnosis Date  ? Chickenpox 1960  ? Hypercholesteremia 2017  ? Hypertension 1980  ? Mitral valve prolapse 2016  ? Shingles 2016  ? ? ?Past Surgical History:  ?Procedure Laterality Date  ? AMPUTATION FINGER Left 1981  ? third finger   ? APPENDECTOMY Bilateral 1968  ? BREAST EXCISIONAL BIOPSY Bilateral 2005  ? benign  ? BREAST LUMPECTOMY Bilateral 2005  ? COLONOSCOPY WITH PROPOFOL N/A 12/12/2019  ? Procedure: COLONOSCOPY WITH PROPOFOL;  Surgeon: Lin Landsman, MD;  Location: [facility] ENDOSCOPY;  Service: Gastroenterology;  Laterality: N/A;  ? ? ?Current Medications: ?Current Meds  ?Medication Sig  ? ALPRAZolam (XANAX) 0.5 MG tablet Take 1 tablet (0.5 mg total) by mouth daily as  needed for anxiety.  ? felodipine (PLENDIL) 5 MG 24 hr tablet Take 1 tablet (5 mg total) by mouth daily.  ? rosuvastatin (CRESTOR) 20 MG tablet TAKE 1 TABLET IN THE EVENING 5 DAYS PER WEEK  ? sertraline (ZOLOFT) 100 MG tablet TAKE 1 TABLET BY MOUTH EVERYDAY AT BEDTIME  ? telmisartan (MICARDIS) 80 MG tablet TAKE 1 TABLET BY MOUTH EVERY DAY  ? VITAMIN D, CHOLECALCIFEROL, PO Take 800 Units by mouth daily.  ?  ? ?Allergies:   Codeine and Methotrexate derivatives  ? ?Social History  ? ?Socioeconomic History  ? Marital status: Married  ?  Spouse name: Danielle Humphrey  ? Number of children: 2  ? Years of education: 2  ? Highest education level: Not on file  ?Occupational History  ? Occupation: [employer]  ?Tobacco Use  ? Smoking status: Never  ? Smokeless tobacco: Never  ?Vaping Use  ? Vaping Use: Never used  ?Substance and Sexual Activity  ? Alcohol use: Yes  ?  Alcohol/week: 7.0 standard drinks  ?  Types: 3 Glasses of wine, 4 Cans of beer per week  ?  Comment: bourbon and diet ginger ale, red wine 3 x per week.  ? Drug use: No  ? Sexual activity: Yes  ?Other Topics Concern  ? Not on file  ?Social History Narrative  ? Lives in [place]  ?   ? Married  ?   ? 2 children  ? Granddaughter-  two of them  ?   ? Work- Market researcher for [employer]  ? ?Social Determinants of Health  ? ?Financial Resource Strain: Not on file  ?Food Insecurity: Not on file  ?Transportation Needs: Not on file  ?Physical Activity: Not on file  ?Stress: Not on file  ?Social Connections: Not on file  ?  ? ?Family History: ?The patient's family history includes Breast cancer (age of onset: 61) in her mother; Dementia in her mother; Diabetes type II in her sister; Heart attack in her father; Heart disease in her brother; Hypercholesterolemia in her father; Hypertension in her father; Lupus in her mother; Stroke in her mother. There is no history of Ovarian cancer. ? ?ROS:   ?Please see the history of present illness.    ? All other systems  reviewed and are negative. ? ?EKGs/Labs/Other Studies Reviewed:   ? ?The following studies were reviewed today: ? ? ?EKG:  EKG is  ordered today.  The ekg ordered today demonstrates normal sinus rhythm, normal EKG. ? ?Recent Labs: ?10/19/2021: ALT 12; BUN 17; Creatinine, Ser 0.89; Potassium 3.9; Sodium 139 ?10/25/2021: Magnesium 2.4 ?11/21/2021: Hemoglobin 13.2; Platelets 224.0; TSH 3.92  ?Recent Lipid Panel ?   ?Component Value Date/Time  ? CHOL 199 02/09/2021 0859  ? TRIG 115.0 02/09/2021 0859  ? HDL 51.70 02/09/2021 0859  ? CHOLHDL 4 02/09/2021 0859  ? VLDL 23.0 02/09/2021 0859  ? [place] 124 (H) 02/09/2021 0859  ? LDLDIRECT 109.0 08/09/2020 0826  ? ? ?Physical Exam:   ? ?VS:  BP 140/82 (BP Location: Left Arm, Patient Position: Sitting, Cuff Size: Normal)   Pulse 70   Ht 5' 10.5" (1.791 m)   Wt 199 lb (90.3 kg)   SpO2 98%   BMI 28.15 kg/m?    ? ?Wt Readings from Last 3 Encounters:  ?12/26/21 199 lb (90.3 kg)  ?11/21/21 199 lb 3.2 oz (90.4 kg)  ?10/25/21 199 lb (90.3 kg)  ?  ? ?GEN:  Well nourished, well developed in no acute distress ?HEENT: Normal ?NECK: No JVD; No carotid bruits ?LYMPHATICS: No lymphadenopathy ?CARDIAC: RRR, no murmurs, rubs, gallops ?RESPIRATORY:  Clear to auscultation without rales, wheezing or rhonchi  ?ABDOMEN: Soft, non-tender, non-distended ?MUSCULOSKELETAL:  No edema; No deformity  ?SKIN: Warm and dry ?NEUROLOGIC:  Alert and oriented x 3 ?PSYCHIATRIC:  Normal affect  ? ?ASSESSMENT:   ? ?1. Mitral valve insufficiency, unspecified etiology   ?2. Mixed hyperlipidemia   ?3. Primary hypertension   ?4. Dizziness   ? ?PLAN:   ? ?In order of problems listed above: ? ?mild to moderate mitral valve regurgitation.  Repeat echocardiogram on 10/2018 20 showing normal EF 60 to 65%, mild to moderate MR which is stable from prior.  Continue monitoring with serial echoes ?Hyperlipidemia, Crestor 20 mg daily. ?Blood pressure  controlled.  Continue telmisartan, felodipine '5mg'$  qd. ?Dizziness, no additional  episodes of syncope. orthostasis, dehydration likely etiology. Will let ?Blood pressures run a little higher.  Place cardiac monitor to complete work-up. ? ?Follow-up in 6 months. ? ?Medication Adjustments/Labs and Tests Ordered: ?Current medicines are reviewed at length with the patient today.  Concerns regarding medicines are outlined above.  ?No orders of the defined types were placed in this encounter. ? ? ?No orders of the defined types were placed in this encounter. ? ? ? ?Patient Instructions  ?Medication Instructions:  ? ?Your physician recommends that you continue on your current medications as directed. Please refer to the Current Medication list given to you  today. ? ?*If you need a refill on your cardiac medications before your next appointment, please call your pharmacy* ? ? ?Lab Work: ?None ordered ?If you have labs (blood work) drawn today and your tests are completely normal, you will receive your results only by: ?MyChart Message (if you have MyChart) OR ?A paper copy in the mail ?If you have any lab test that is abnormal or we need to change your treatment, we will call you to review the results. ? ? ?Testing/Procedures: ?None ordered ? ? ?Follow-Up: ?At [facility], you and your health needs are our priority.  As part of our continuing mission to provide you with exceptional heart care, we have created designated Provider Care Teams.  These Care Teams include your primary Cardiologist (physician) and Advanced Practice Providers (APPs -  Physician Assistants and Nurse Practitioners) who all work together to provide you with the care you need, when you need it. ? ?We recommend signing up for the patient portal called "MyChart".  Sign up information is provided on this After Visit Summary.  MyChart is used to connect with patients for Virtual Visits (Telemedicine).  Patients are able to view lab/test results, encounter notes, upcoming appointments, etc.  Non-urgent messages can be sent to your  provider as well.   ?To learn more about what you can do with MyChart, go to [email or web address].   ? ?Your next appointment:   ?6 month(s) ? ?The format for your next appointment:   ?In Person ? ?Prov

## 2021-12-26 NOTE — Telephone Encounter (Signed)
Patient here for her OV. Zio was lost in the mail. Reached out to Neeral at [facility] to send patient a new one.  ?

## 2021-12-26 NOTE — Patient Instructions (Signed)
Medication Instructions:  ? ?Your physician recommends that you continue on your current medications as directed. Please refer to the Current Medication list given to you today. ? ?*If you need a refill on your cardiac medications before your next appointment, please call your pharmacy* ? ? ?Lab Work: ?None ordered ?If you have labs (blood work) drawn today and your tests are completely normal, you will receive your results only by: ?MyChart Message (if you have MyChart) OR ?A paper copy in the mail ?If you have any lab test that is abnormal or we need to change your treatment, we will call you to review the results. ? ? ?Testing/Procedures: ?None ordered ? ? ?Follow-Up: ?At [facility], you and your health needs are our priority.  As part of our continuing mission to provide you with exceptional heart care, we have created designated Provider Care Teams.  These Care Teams include your primary Cardiologist (physician) and Advanced Practice Providers (APPs -  Physician Assistants and Nurse Practitioners) who all work together to provide you with the care you need, when you need it. ? ?We recommend signing up for the patient portal called "MyChart".  Sign up information is provided on this After Visit Summary.  MyChart is used to connect with patients for Virtual Visits (Telemedicine).  Patients are able to view lab/test results, encounter notes, upcoming appointments, etc.  Non-urgent messages can be sent to your provider as well.   ?To learn more about what you can do with MyChart, go to [email or web address].   ? ?Your next appointment:   ?6 month(s) ? ?The format for your next appointment:   ?In Person ? ?Provider:   ?You may see Kate Sable, MD or one of the following Advanced Practice Providers on your designated Care Team:   ?Murray Hodgkins, NP ?Christell Faith, PA-C ?Cadence Kathlen Mody, PA-C  ? ? ?Other Instructions ? ? ?

## 2021-12-27 ENCOUNTER — Ambulatory Visit: Payer: No Typology Code available for payment source

## 2021-12-27 ENCOUNTER — Other Ambulatory Visit: Payer: Self-pay

## 2021-12-27 DIAGNOSIS — R55 Syncope and collapse: Secondary | ICD-10-CM

## 2021-12-27 NOTE — Telephone Encounter (Signed)
Received e-mail from [facility] at [facility] requesting a new order and registration for Zio as the first one was to old. He also stated that the patient would not be charged for this monitor. Order sent in today. ?

## 2022-01-27 ENCOUNTER — Other Ambulatory Visit: Payer: Self-pay | Admitting: Family

## 2022-01-27 DIAGNOSIS — F411 Generalized anxiety disorder: Secondary | ICD-10-CM

## 2022-02-21 ENCOUNTER — Ambulatory Visit: Payer: No Typology Code available for payment source | Admitting: Family

## 2022-02-21 ENCOUNTER — Encounter: Payer: Self-pay | Admitting: Family

## 2022-02-21 VITALS — BP 122/80 | HR 73 | Temp 98.1°F | Ht 70.5 in | Wt 197.8 lb

## 2022-02-21 DIAGNOSIS — E7800 Pure hypercholesterolemia, unspecified: Secondary | ICD-10-CM

## 2022-02-21 DIAGNOSIS — F411 Generalized anxiety disorder: Secondary | ICD-10-CM | POA: Diagnosis not present

## 2022-02-21 DIAGNOSIS — I10 Essential (primary) hypertension: Secondary | ICD-10-CM | POA: Diagnosis not present

## 2022-02-21 NOTE — Progress Notes (Signed)
Subjective:    Patient ID: Danielle Humphrey, female    DOB: [birth year], 61 y.o.   MRN: [id number]  CC: Danielle Humphrey is a 61 y.o. female who presents today for follow up.   HPI: Feels well today.  No new complaints She is very excited she plans to retire in July of this year. Hypertension-compliant with felodipine 5 mg, telmisartan 80 mg.  No chest pain or shortness of breath Hyperlipidemia-compliant with Crestor 20 mg  Anxiety-compliant with Zoloft '100mg'$ , xanax 0.'5mg'$ . Stress has decreased with upcoming retirement   HISTORY:  Past Medical History:  Diagnosis Date   Chickenpox 1960   Hypercholesteremia 2017   Hypertension 1980   Mitral valve prolapse 2016   Shingles 2016   Past Surgical History:  Procedure Laterality Date   AMPUTATION FINGER Left 1981   third finger    APPENDECTOMY Bilateral 1968   BREAST EXCISIONAL BIOPSY Bilateral 2005   benign   BREAST LUMPECTOMY Bilateral 2005   COLONOSCOPY WITH PROPOFOL N/A 12/12/2019   Procedure: COLONOSCOPY WITH PROPOFOL;  Surgeon: Lin Landsman, MD;  Location: [facility];  Service: Gastroenterology;  Laterality: N/A;   Family History  Problem Relation Age of Onset   Breast cancer Mother 60       2005   Dementia Mother    Stroke Mother    Lupus Mother    Hypercholesterolemia Father    Heart attack Father        75   Hypertension Father    Diabetes type II Sister    Heart disease Brother    Ovarian cancer Neg Hx     Allergies: Codeine and Methotrexate derivatives Current Outpatient Medications on File Prior to Visit  Medication Sig Dispense Refill   ALPRAZolam (XANAX) 0.5 MG tablet TAKE 1 TABLET BY MOUTH EVERY DAY AS NEEDED FOR ANXIETY 30 tablet 1   felodipine (PLENDIL) 5 MG 24 hr tablet Take 1 tablet (5 mg total) by mouth daily. 90 tablet 1   rosuvastatin (CRESTOR) 20 MG tablet TAKE 1 TABLET IN THE EVENING 5 DAYS PER WEEK 20 tablet 8   sertraline (ZOLOFT) 100 MG tablet TAKE 1 TABLET BY MOUTH EVERYDAY AT BEDTIME 90  tablet 5   telmisartan (MICARDIS) 80 MG tablet TAKE 1 TABLET BY MOUTH EVERY DAY 30 tablet 8   VITAMIN D, CHOLECALCIFEROL, PO Take 800 Units by mouth daily.     No current facility-administered medications on file prior to visit.    Social History   Tobacco Use   Smoking status: Never   Smokeless tobacco: Never  Vaping Use   Vaping Use: Never used  Substance Use Topics   Alcohol use: Yes    Alcohol/week: 7.0 standard drinks    Types: 3 Glasses of wine, 4 Cans of beer per week    Comment: bourbon and diet ginger ale, red wine 3 x per week.   Drug use: No    Review of Systems  Constitutional:  Negative for chills and fever.  Respiratory:  Negative for cough.   Cardiovascular:  Negative for chest pain and palpitations.  Gastrointestinal:  Negative for nausea and vomiting.     Objective:    BP 122/80 (BP Location: Left Arm, Patient Position: Sitting, Cuff Size: Large)   Pulse 73   Temp 98.1 F (36.7 C) (Oral)   Ht 5' 10.5" (1.791 m)   Wt 197 lb 12.8 oz (89.7 kg)   SpO2 98%   BMI 27.98 kg/m  BP Readings from Last  3 Encounters:  02/21/22 122/80  12/26/21 140/82  11/21/21 130/76   Wt Readings from Last 3 Encounters:  02/21/22 197 lb 12.8 oz (89.7 kg)  12/26/21 199 lb (90.3 kg)  11/21/21 199 lb 3.2 oz (90.4 kg)    Physical Exam Vitals reviewed.  Constitutional:      Appearance: She is well-developed.  Eyes:     Conjunctiva/sclera: Conjunctivae normal.  Cardiovascular:     Rate and Rhythm: Normal rate and regular rhythm.     Pulses: Normal pulses.     Heart sounds: Normal heart sounds.  Pulmonary:     Effort: Pulmonary effort is normal.     Breath sounds: Normal breath sounds. No wheezing, rhonchi or rales.  Skin:    General: Skin is warm and dry.  Neurological:     Mental Status: She is alert.  Psychiatric:        Speech: Speech normal.        Behavior: Behavior normal.        Thought Content: Thought content normal.       Assessment & Plan:    Problem List Items Addressed This Visit       Cardiovascular and Mediastinum   HTN (hypertension) - Primary    Chronic, stable.Continue  felodipine 5 mg, telmisartan 80 mg       Relevant Orders   Comprehensive metabolic panel     Other   Generalized anxiety disorder    Chronic, stable.  Continue Zoloft '100mg'$ , xanax 0.'5mg'$        HLD (hyperlipidemia)    Pending lipid panel.  Continue Crestor 20 mg       Relevant Orders   Lipid panel     I am having Danielle Humphrey maintain her (VITAMIN D, CHOLECALCIFEROL, PO), sertraline, telmisartan, felodipine, rosuvastatin, and ALPRAZolam.   No orders of the defined types were placed in this encounter.   Return precautions given.   Risks, benefits, and alternatives of the medications and treatment plan prescribed today were discussed, and patient expressed understanding.   Education regarding symptom management and diagnosis given to patient on AVS.  Continue to follow with Burnard Hawthorne, FNP for routine health maintenance.   Danielle Humphrey and I agreed with plan.   Mable Paris, FNP

## 2022-02-21 NOTE — Assessment & Plan Note (Signed)
Pending lipid panel.  Continue Crestor 20 mg

## 2022-02-21 NOTE — Assessment & Plan Note (Signed)
Chronic, stable.  Continue Zoloft '100mg'$ , xanax 0.'5mg'$ 

## 2022-02-21 NOTE — Assessment & Plan Note (Signed)
Chronic, stable.Continue  felodipine 5 mg, telmisartan 80 mg

## 2022-02-24 ENCOUNTER — Other Ambulatory Visit: Payer: No Typology Code available for payment source

## 2022-02-24 ENCOUNTER — Other Ambulatory Visit (INDEPENDENT_AMBULATORY_CARE_PROVIDER_SITE_OTHER): Payer: No Typology Code available for payment source

## 2022-02-24 DIAGNOSIS — E7800 Pure hypercholesterolemia, unspecified: Secondary | ICD-10-CM | POA: Diagnosis not present

## 2022-02-24 DIAGNOSIS — I10 Essential (primary) hypertension: Secondary | ICD-10-CM | POA: Diagnosis not present

## 2022-02-24 LAB — COMPREHENSIVE METABOLIC PANEL
ALT: 13 U/L (ref 0–35)
AST: 17 U/L (ref 0–37)
Albumin: 4.3 g/dL (ref 3.5–5.2)
Alkaline Phosphatase: 44 U/L (ref 39–117)
BUN: 21 mg/dL (ref 6–23)
CO2: 25 mEq/L (ref 19–32)
Calcium: 9.6 mg/dL (ref 8.4–10.5)
Chloride: 103 mEq/L (ref 96–112)
Creatinine, Ser: 0.95 mg/dL (ref 0.40–1.20)
GFR: 64.11 mL/min (ref >60.00)
Glucose, Bld: 98 mg/dL (ref 70–99)
Potassium: 3.6 mEq/L (ref 3.5–5.1)
Sodium: 136 mEq/L (ref 135–145)
Total Bilirubin: 0.7 mg/dL (ref 0.2–1.2)
Total Protein: 7.5 g/dL (ref 6.0–8.3)

## 2022-02-24 LAB — LIPID PANEL
Cholesterol: 179 mg/dL (ref 0–200)
HDL: 51.1 mg/dL (ref >39.00)
NonHDL: 128.06
Total CHOL/HDL Ratio: 4
Triglycerides: 215 mg/dL — ABNORMAL HIGH (ref 0.0–149.0)
VLDL: 43 mg/dL — ABNORMAL HIGH (ref 0.0–40.0)

## 2022-02-24 LAB — LDL CHOLESTEROL, DIRECT: Direct LDL: 105 mg/dL

## 2022-03-06 ENCOUNTER — Telehealth: Payer: Self-pay

## 2022-03-06 NOTE — Telephone Encounter (Signed)
Lmtcb to office to go over results

## 2022-03-07 NOTE — Telephone Encounter (Signed)
Pt returning call. Pt requesting callback.  °

## 2022-03-07 NOTE — Telephone Encounter (Signed)
Spoke to patient about notes and she stated that she had already viewed her results and she did not have any questions.

## 2022-03-13 ENCOUNTER — Other Ambulatory Visit: Payer: Self-pay | Admitting: Family

## 2022-03-13 DIAGNOSIS — F322 Major depressive disorder, single episode, severe without psychotic features: Secondary | ICD-10-CM

## 2022-04-11 ENCOUNTER — Other Ambulatory Visit: Payer: Self-pay | Admitting: Family

## 2022-04-11 DIAGNOSIS — I10 Essential (primary) hypertension: Secondary | ICD-10-CM

## 2022-04-11 DIAGNOSIS — R55 Syncope and collapse: Secondary | ICD-10-CM

## 2022-06-23 ENCOUNTER — Ambulatory Visit: Payer: No Typology Code available for payment source | Admitting: Cardiology

## 2022-08-10 ENCOUNTER — Telehealth: Payer: Self-pay | Admitting: Family

## 2022-08-10 DIAGNOSIS — R55 Syncope and collapse: Secondary | ICD-10-CM

## 2022-08-10 MED ORDER — FELODIPINE ER 5 MG PO TB24: 5.0000 mg | ORAL_TABLET | Freq: Every day | ORAL | 1 refills | Status: DC

## 2022-08-10 NOTE — Telephone Encounter (Signed)
Pt need refill on felodipine sent to [facility]

## 2022-08-10 NOTE — Addendum Note (Signed)
Addended by: [place], Tienna Bienkowski on: 08/10/2022 01:37 PM   Modules accepted: Orders

## 2022-08-11 ENCOUNTER — Telehealth: Payer: Self-pay | Admitting: Cardiology

## 2022-08-11 NOTE — Telephone Encounter (Signed)
*  STAT* If patient is at the pharmacy, call can be transferred to refill team.   1. Which medications need to be refilled? (please list name of each medication and dose if known) telmisartan (MICARDIS) 80 MG tablet   2. Which pharmacy/location (including street and city if local pharmacy) is medication to be sent to? [facility] [id number] - [address], [place]   3. Do they need a 30 day or 90 day supply? [place]

## 2022-08-11 NOTE — Telephone Encounter (Signed)
Needs to reschedule overdue 6 month F/U appt for refills. Thank you!

## 2022-08-14 ENCOUNTER — Other Ambulatory Visit: Payer: Self-pay

## 2022-08-14 ENCOUNTER — Telehealth: Payer: Self-pay | Admitting: Family

## 2022-08-14 DIAGNOSIS — F322 Major depressive disorder, single episode, severe without psychotic features: Secondary | ICD-10-CM

## 2022-08-14 DIAGNOSIS — E7800 Pure hypercholesterolemia, unspecified: Secondary | ICD-10-CM

## 2022-08-14 DIAGNOSIS — I10 Essential (primary) hypertension: Secondary | ICD-10-CM

## 2022-08-14 MED ORDER — ROSUVASTATIN CALCIUM 20 MG PO TABS: ORAL_TABLET | ORAL | 8 refills | Status: DC

## 2022-08-14 MED ORDER — TELMISARTAN 80 MG PO TABS: 80.0000 mg | ORAL_TABLET | Freq: Every day | ORAL | 4 refills | Status: DC

## 2022-08-14 MED ORDER — SERTRALINE HCL 100 MG PO TABS: ORAL_TABLET | ORAL | 17 refills | Status: DC

## 2022-08-14 NOTE — Telephone Encounter (Signed)
Refills sent to patients pharmacy

## 2022-08-14 NOTE — Telephone Encounter (Signed)
Pt called stating she has changed pharmacy to [facility] and need a refill on rosuvastatin and sertraline 90 day sent to the new pharmacy

## 2022-08-14 NOTE — Telephone Encounter (Signed)
Spoke to pt and informed her that her rxs were sent in

## 2022-08-14 NOTE — Telephone Encounter (Signed)
Patient scheduled in Jan due to insurance

## 2022-10-06 ENCOUNTER — Encounter: Payer: Self-pay | Admitting: Cardiology

## 2022-10-06 ENCOUNTER — Ambulatory Visit: Payer: Commercial Managed Care - HMO | Attending: Cardiology | Admitting: Cardiology

## 2022-10-06 VITALS — BP 150/94 | HR 61 | Ht 70.0 in | Wt 184.8 lb

## 2022-10-06 DIAGNOSIS — R42 Dizziness and giddiness: Secondary | ICD-10-CM | POA: Diagnosis not present

## 2022-10-06 DIAGNOSIS — I340 Nonrheumatic mitral (valve) insufficiency: Secondary | ICD-10-CM

## 2022-10-06 DIAGNOSIS — I10 Essential (primary) hypertension: Secondary | ICD-10-CM | POA: Diagnosis not present

## 2022-10-06 DIAGNOSIS — E782 Mixed hyperlipidemia: Secondary | ICD-10-CM

## 2022-10-06 NOTE — Progress Notes (Signed)
Cardiology Office Note:    Date:  10/06/2022   ID:  Danielle, Humphrey Jan 12, 1961, MRN [id number]  PCP:  Burnard Hawthorne, FNP  Cardiologist:  Kate Sable, MD  Electrophysiologist:  None   Referring MD: Burnard Hawthorne, FNP   Chief Complaint  Patient presents with   Follow-up    6 month f/u, no new cardiac concerns     History of Present Illness:    Danielle Humphrey is a 62 y.o. female with a hx of mild to moderate MR, hypertension, hyperlipidemia who presents for follow-up.    Being seen for hypertension, dizziness, syncope.  She was orthostatic, BP medications were reduced.  Cardiac monitor was placed but this was lost after being ill.  She states feeling much better, give up coffee, drinks green tea.  States being very active with taking care of grandchildren, feels well, no further syncopal episodes.   Prior notes Echo 02/24/8314, normal systolic function, EF 17% mild to moderate MR Echocardiogram 03/10/736 normal systolic function, EF 10% mild to moderate MR   Past Medical History:  Diagnosis Date   Chickenpox 1960   Hypercholesteremia 2017   Hypertension 1980   Mitral valve prolapse 2016   Shingles 2016    Past Surgical History:  Procedure Laterality Date   AMPUTATION FINGER Left 1981   third finger    APPENDECTOMY Bilateral 1968   BREAST EXCISIONAL BIOPSY Bilateral 2005   benign   BREAST LUMPECTOMY Bilateral 2005   COLONOSCOPY WITH PROPOFOL N/A 12/12/2019   Procedure: COLONOSCOPY WITH PROPOFOL;  Surgeon: Lin Landsman, MD;  Location: [facility];  Service: Gastroenterology;  Laterality: N/A;    Current Medications: Current Meds  Medication Sig   felodipine (PLENDIL) 5 MG 24 hr tablet Take 1 tablet (5 mg total) by mouth daily. NEED APPT FOR FURTHER REFILLS   rosuvastatin (CRESTOR) 20 MG tablet TAKE 1 TABLET IN THE EVENING 5 DAYS PER WEEK   sertraline (ZOLOFT) 100 MG tablet TAKE 1 TABLET BY MOUTH EVERYDAY AT BEDTIME   telmisartan (MICARDIS) 80  MG tablet Take 1 tablet (80 mg total) by mouth daily.   VITAMIN D, CHOLECALCIFEROL, PO Take 800 Units by mouth daily.     Allergies:   Codeine and Methotrexate derivatives   Social History   Socioeconomic History   Marital status: Married    Spouse name: Liat Mayol   Number of children: 2   Years of education: 2   Highest education level: Not on file  Occupational History   Occupation: [employer]  Tobacco Use   Smoking status: Former    Types: Cigarettes    Quit date: 03/25/1985    Years since quitting: 37.5   Smokeless tobacco: Never  Vaping Use   Vaping Use: Never used  Substance and Sexual Activity   Alcohol use: Yes    Alcohol/week: 7.0 standard drinks of alcohol    Types: 3 Glasses of wine, 4 Cans of beer per week    Comment: bourbon and diet ginger ale, red wine 3 x per week.   Drug use: Yes    Types: Marijuana   Sexual activity: Yes  Other Topics Concern   Not on file  Social History Narrative   Lives in [place]      Married      2 children   Granddaughter- two of them      Work- Market researcher for Paper distribution   Social Determinants of Radio broadcast assistant Strain: Not  on file  Food Insecurity: Not on file  Transportation Needs: Not on file  Physical Activity: Not on file  Stress: Not on file  Social Connections: Not on file     Family History: The patient's family history includes Breast cancer (age of onset: 24) in her mother; Dementia in her mother; Diabetes type II in her sister; Heart attack in her father; Heart disease in her brother; Hypercholesterolemia in her father; Hypertension in her father; Lupus in her mother; Stroke in her mother. There is no history of Ovarian cancer.  ROS:   Please see the history of present illness.     All other systems reviewed and are negative.  EKGs/Labs/Other Studies Reviewed:    The following studies were reviewed today:   EKG:  EKG is  ordered today.  The ekg ordered today  demonstrates normal sinus rhythm  Recent Labs: 10/25/2021: Magnesium 2.4 11/21/2021: Hemoglobin 13.2; Platelets 224.0; TSH 3.92 02/24/2022: ALT 13; BUN 21; Creatinine, Ser 0.95; Potassium 3.6; Sodium 136  Recent Lipid Panel    Component Value Date/Time   CHOL 179 02/24/2022 0743   TRIG 215.0 (H) 02/24/2022 0743   HDL 51.10 02/24/2022 0743   CHOLHDL 4 02/24/2022 0743   VLDL 43.0 (H) 02/24/2022 0743   LDLCALC 124 (H) 02/09/2021 0859   LDLDIRECT 105.0 02/24/2022 0743    Physical Exam:    VS:  BP (!) 150/94 (BP Location: Left Arm)   Pulse 61   Ht '5\' 10"'$  (1.778 m)   Wt 184 lb 12.8 oz (83.8 kg)   SpO2 97%   BMI 26.52 kg/m     Wt Readings from Last 3 Encounters:  10/06/22 184 lb 12.8 oz (83.8 kg)  02/21/22 197 lb 12.8 oz (89.7 kg)  12/26/21 199 lb (90.3 kg)     GEN:  Well nourished, well developed in no acute distress HEENT: Normal NECK: No JVD; No carotid bruits CARDIAC: RRR, no murmurs, rubs, gallops RESPIRATORY:  Clear to auscultation without rales, wheezing or rhonchi  ABDOMEN: Soft, non-tender, non-distended MUSCULOSKELETAL:  No edema; No deformity  SKIN: Warm and dry NEUROLOGIC:  Alert and oriented x 3 PSYCHIATRIC:  Normal affect   ASSESSMENT:    1. Mitral valve insufficiency, unspecified etiology   2. Mixed hyperlipidemia   3. Essential hypertension   4. Dizziness    PLAN:    In order of problems listed above:  mild to moderate mitral valve regurgitation. echocardiogram on 2/23 showing normal EF 60 to 65%, mild to moderate MR which is stable from prior.  Continue monitoring with serial echoes.  Repeat in 10/2023 Hyperlipidemia, Crestor 20 mg daily. Blood pressure, elevated today usually controlled.  Continue telmisartan, felodipine '5mg'$  qd.  Okay to let BP run a little high in the 130s to 140s. Dizziness, likely due to orthostasis, dehydration.  Currently resolved.  Follow-up in 1 year.  Medication Adjustments/Labs and Tests Ordered: Current medicines are  reviewed at length with the patient today.  Concerns regarding medicines are outlined above.  Orders Placed This Encounter  Procedures   EKG 12-Lead   ECHOCARDIOGRAM COMPLETE    No orders of the defined types were placed in this encounter.    Patient Instructions  Medication Instructions:   Your physician recommends that you continue on your current medications as directed. Please refer to the Current Medication list given to you today.  *If you need a refill on your cardiac medications before your next appointment, please call your pharmacy*   Lab Work:  None  Ordered  If you have labs (blood work) drawn today and your tests are completely normal, you will receive your results only by: [place] (if you have MyChart) OR A paper copy in the mail If you have any lab test that is abnormal or we need to change your treatment, we will call you to review the results.   Testing/Procedures:  Echocardiogram - February 2025   Your physician has requested that you have an echocardiogram. Echocardiography is a painless test that uses sound waves to create images of your heart. It provides your doctor with information about the size and shape of your heart and how well your heart's chambers and valves are working. This procedure takes approximately one hour. There are no restrictions for this procedure. Please note; depending on visual quality an IV may need to be placed.     Follow-Up: At [facility], you and your health needs are our priority.  As part of our continuing mission to provide you with exceptional heart care, we have created designated Provider Care Teams.  These Care Teams include your primary Cardiologist (physician) and Advanced Practice Providers (APPs -  Physician Assistants and Nurse Practitioners) who all work together to provide you with the care you need, when you need it.  We recommend signing up for the patient portal called "MyChart".  Sign up  information is provided on this After Visit Summary.  MyChart is used to connect with patients for Virtual Visits (Telemedicine).  Patients are able to view lab/test results, encounter notes, upcoming appointments, etc.  Non-urgent messages can be sent to your provider as well.   To learn more about what you can do with MyChart, go to [email or web address].    Your next appointment:    After Echocardiogram in 2025  Provider:   You may see Kate Sable, MD or one of the following Advanced Practice Providers on your designated Care Team:   Murray Hodgkins, NP Christell Faith, PA-C Cadence Kathlen Mody, PA-C Gerrie Nordmann, NP   Signed, Kate Sable, MD  10/06/2022 10:11 AM    [place]

## 2022-10-06 NOTE — Patient Instructions (Signed)
Medication Instructions:   Your physician recommends that you continue on your current medications as directed. Please refer to the Current Medication list given to you today.  *If you need a refill on your cardiac medications before your next appointment, please call your pharmacy*   Lab Work:  None Ordered  If you have labs (blood work) drawn today and your tests are completely normal, you will receive your results only by: [place] (if you have MyChart) OR A paper copy in the mail If you have any lab test that is abnormal or we need to change your treatment, we will call you to review the results.   Testing/Procedures:  Echocardiogram - February 2025   Your physician has requested that you have an echocardiogram. Echocardiography is a painless test that uses sound waves to create images of your heart. It provides your doctor with information about the size and shape of your heart and how well your heart's chambers and valves are working. This procedure takes approximately one hour. There are no restrictions for this procedure. Please note; depending on visual quality an IV may need to be placed.     Follow-Up: At [facility], you and your health needs are our priority.  As part of our continuing mission to provide you with exceptional heart care, we have created designated Provider Care Teams.  These Care Teams include your primary Cardiologist (physician) and Advanced Practice Providers (APPs -  Physician Assistants and Nurse Practitioners) who all work together to provide you with the care you need, when you need it.  We recommend signing up for the patient portal called "MyChart".  Sign up information is provided on this After Visit Summary.  MyChart is used to connect with patients for Virtual Visits (Telemedicine).  Patients are able to view lab/test results, encounter notes, upcoming appointments, etc.  Non-urgent messages can be sent to your provider as well.    To learn more about what you can do with MyChart, go to [email or web address].    Your next appointment:    After Echocardiogram in 2025  Provider:   You may see Kate Sable, MD or one of the following Advanced Practice Providers on your designated Care Team:   Murray Hodgkins, NP Christell Faith, PA-C Cadence Kathlen Mody, PA-C Gerrie Nordmann, NP

## 2022-11-09 ENCOUNTER — Other Ambulatory Visit: Payer: Self-pay

## 2022-11-09 DIAGNOSIS — I10 Essential (primary) hypertension: Secondary | ICD-10-CM

## 2022-11-09 MED ORDER — TELMISARTAN 80 MG PO TABS: 80.0000 mg | ORAL_TABLET | Freq: Every day | ORAL | 4 refills | Status: DC

## 2023-04-29 ENCOUNTER — Other Ambulatory Visit: Payer: Self-pay | Admitting: Family

## 2023-04-29 DIAGNOSIS — E7800 Pure hypercholesterolemia, unspecified: Secondary | ICD-10-CM

## 2023-05-07 ENCOUNTER — Other Ambulatory Visit: Payer: Self-pay

## 2023-05-07 DIAGNOSIS — F322 Major depressive disorder, single episode, severe without psychotic features: Secondary | ICD-10-CM

## 2023-05-07 DIAGNOSIS — I10 Essential (primary) hypertension: Secondary | ICD-10-CM

## 2023-05-07 DIAGNOSIS — E7800 Pure hypercholesterolemia, unspecified: Secondary | ICD-10-CM

## 2023-05-07 MED ORDER — TELMISARTAN 80 MG PO TABS: 80.0000 mg | ORAL_TABLET | Freq: Every day | ORAL | 4 refills | Status: DC

## 2023-05-07 MED ORDER — SERTRALINE HCL 100 MG PO TABS: ORAL_TABLET | ORAL | 17 refills | Status: DC

## 2023-05-07 MED ORDER — ROSUVASTATIN CALCIUM 20 MG PO TABS: ORAL_TABLET | ORAL | 8 refills | Status: DC

## 2023-08-08 ENCOUNTER — Other Ambulatory Visit: Payer: Self-pay | Admitting: Family

## 2023-08-08 DIAGNOSIS — R55 Syncope and collapse: Secondary | ICD-10-CM

## 2023-08-10 NOTE — Telephone Encounter (Signed)
LVM for pt to call back to schedule f/up appt

## 2023-08-13 NOTE — Telephone Encounter (Signed)
Patient states she is returning our call.  I read message from Jenate [place], CMA, to patient.  Patient states her insurance starts in January, 2025, so she would like to schedule her appointment with Rennie Plowman, FNP, at that time.  I scheduled an appointment for patient to see Rennie Plowman, FNP, on 09/27/2023.

## 2023-08-14 NOTE — Telephone Encounter (Signed)
Noted  

## 2023-09-27 ENCOUNTER — Ambulatory Visit: Payer: 59 | Admitting: Family

## 2023-09-27 ENCOUNTER — Ambulatory Visit: Payer: 59

## 2023-09-27 ENCOUNTER — Encounter: Payer: Self-pay | Admitting: Family

## 2023-09-27 VITALS — BP 132/78 | HR 70 | Temp 97.8°F | Ht 70.0 in | Wt 174.6 lb

## 2023-09-27 DIAGNOSIS — R042 Hemoptysis: Secondary | ICD-10-CM

## 2023-09-27 DIAGNOSIS — Z87898 Personal history of other specified conditions: Secondary | ICD-10-CM | POA: Diagnosis not present

## 2023-09-27 DIAGNOSIS — Z1231 Encounter for screening mammogram for malignant neoplasm of breast: Secondary | ICD-10-CM

## 2023-09-27 DIAGNOSIS — Z8639 Personal history of other endocrine, nutritional and metabolic disease: Secondary | ICD-10-CM | POA: Diagnosis not present

## 2023-09-27 DIAGNOSIS — I10 Essential (primary) hypertension: Secondary | ICD-10-CM

## 2023-09-27 DIAGNOSIS — E7800 Pure hypercholesterolemia, unspecified: Secondary | ICD-10-CM | POA: Diagnosis not present

## 2023-09-27 DIAGNOSIS — Z23 Encounter for immunization: Secondary | ICD-10-CM

## 2023-09-27 LAB — HEMOGLOBIN A1C: Hgb A1c MFr Bld: 5.9 % (ref 4.6–6.5)

## 2023-09-27 LAB — CBC WITH DIFFERENTIAL/PLATELET
Basophils Absolute: 0 10*3/uL (ref 0.0–0.1)
Basophils Relative: 0.5 % (ref 0.0–3.0)
Eosinophils Absolute: 0.3 10*3/uL (ref 0.0–0.7)
Eosinophils Relative: 2.9 % (ref 0.0–5.0)
HCT: 43.3 % (ref 36.0–46.0)
Hemoglobin: 14.3 g/dL (ref 12.0–15.0)
Lymphocytes Relative: 23.6 % (ref 12.0–46.0)
Lymphs Abs: 2.1 10*3/uL (ref 0.7–4.0)
MCHC: 33 g/dL (ref 30.0–36.0)
MCV: 94.2 fL (ref 78.0–100.0)
Monocytes Absolute: 0.5 10*3/uL (ref 0.1–1.0)
Monocytes Relative: 6.1 % (ref 3.0–12.0)
Neutro Abs: 6 10*3/uL (ref 1.4–7.7)
Neutrophils Relative %: 66.9 % (ref 43.0–77.0)
Platelets: 249 10*3/uL (ref 150.0–400.0)
RBC: 4.6 Mil/uL (ref 3.87–5.11)
RDW: 13 % (ref 11.5–15.5)
WBC: 9 10*3/uL (ref 4.0–10.5)

## 2023-09-27 LAB — LIPID PANEL
Cholesterol: 240 mg/dL — ABNORMAL HIGH (ref 0–200)
HDL: 57 mg/dL (ref >39.00)
LDL Cholesterol: 135 mg/dL — ABNORMAL HIGH (ref 0–99)
NonHDL: 182.73
Total CHOL/HDL Ratio: 4
Triglycerides: 238 mg/dL — ABNORMAL HIGH (ref 0.0–149.0)
VLDL: 47.6 mg/dL — ABNORMAL HIGH (ref 0.0–40.0)

## 2023-09-27 LAB — COMPREHENSIVE METABOLIC PANEL
ALT: 13 U/L (ref 0–35)
AST: 18 U/L (ref 0–37)
Albumin: 4.9 g/dL (ref 3.5–5.2)
Alkaline Phosphatase: 44 U/L (ref 39–117)
BUN: 19 mg/dL (ref 6–23)
CO2: 24 meq/L (ref 19–32)
Calcium: 10.3 mg/dL (ref 8.4–10.5)
Chloride: 101 meq/L (ref 96–112)
Creatinine, Ser: 0.91 mg/dL (ref 0.40–1.20)
GFR: 66.76 mL/min (ref >60.00)
Glucose, Bld: 115 mg/dL — ABNORMAL HIGH (ref 70–99)
Potassium: 3.6 meq/L (ref 3.5–5.1)
Sodium: 138 meq/L (ref 135–145)
Total Bilirubin: 0.6 mg/dL (ref 0.2–1.2)
Total Protein: 8 g/dL (ref 6.0–8.3)

## 2023-09-27 LAB — URINALYSIS, ROUTINE W REFLEX MICROSCOPIC
Bilirubin Urine: NEGATIVE
Hgb urine dipstick: NEGATIVE
Ketones, ur: NEGATIVE
Leukocytes,Ua: NEGATIVE
Nitrite: NEGATIVE
RBC / HPF: NONE SEEN (ref 0–?)
Specific Gravity, Urine: 1.015 (ref 1.000–1.030)
Urine Glucose: 100 — AB
Urobilinogen, UA: 0.2 (ref 0.0–1.0)
pH: 7 (ref 5.0–8.0)

## 2023-09-27 LAB — VITAMIN D 25 HYDROXY (VIT D DEFICIENCY, FRACTURES): VITD: 24.7 ng/mL — ABNORMAL LOW (ref 30.00–100.00)

## 2023-09-27 LAB — TSH: TSH: 5.62 u[IU]/mL — ABNORMAL HIGH (ref 0.35–5.50)

## 2023-09-27 NOTE — Addendum Note (Signed)
 Addended by: Allegra Grana on: 09/27/2023 09:32 AM   Modules accepted: Orders

## 2023-09-27 NOTE — Addendum Note (Signed)
 Addended by: [place], Jonathon Tan on: 09/27/2023 09:52 AM   Modules accepted: Orders

## 2023-09-27 NOTE — Patient Instructions (Addendum)
 Please avoid alcohol use as discussed.  You may continue over-the-counter omeprazole  particularly as this has been helpful for cough. Please start over-the-counter Mucinex DM.  Pending chest x-ray referral to pulmonology. If you were to see increased or bright red blood or experience shortness of breath, abdominal pain, the symptoms would warrant emergency room evaluation  Hemoptysis Hemoptysis is coughing up blood. With mild hemoptysis, you may cough up small amounts of blood-streaked saliva and mucus from your lungs (sputum). With severe hemoptysis, you may cough up a lot of blood. Coughing up 1-2 cups (240-480 mL) of blood within 24 hours is a medical emergency. Common causes of mild (nonmassive) hemoptysis include: An infection in your nose, throat, or lungs, such as bronchitis, pneumonia, bronchiectasis, asthma, or chronic obstructive pulmonary disease (COPD). Nosebleeds. Breathing in a foreign object. Common causes of severe (massive) hemoptysis include: Tuberculosis (TB). A tumor in the lungs or upper airway. A blood clot in the lungs (pulmonary embolism). Stomach bleeding or ulcer. Taking blood thinner (anticoagulant) medicine. Having a medical condition that keeps your blood from normal clotting. Sometimes, the cause is not known. Follow these instructions at home: Medicines If you were prescribed an antibiotic medicine, take it as told by your health care provider. Do not stop using the antibiotic even if you start to feel better. Take over-the-counter and prescription medicines only as told by your health care provider. General instructions  Do not use any products that contain nicotine or tobacco. These products include cigarettes, chewing tobacco, and vaping devices, such as e-cigarettes. If you need help quitting, ask your health care provider. Return to your normal activities as told by your health care provider. Ask your health care provider what activities are safe for you.  This includes any exercise or air travel. Keep all follow-up visits. This is important. Contact a health care provider if: You have a fever over 100.70F (38C). The amount of bleeding increases. The blood looks brighter or darker red. Get help right away if you: Cough up fresh blood or blood clots. Cough up 1-2 cups (240-480 mL) in 24 hours. Have trouble breathing. Feel like you are choking. Have chest pain, light-headedness, or dizziness. These symptoms may be an emergency. Get help right away. Call 911. Do not wait to see if the symptoms will go away. Do not drive yourself to the hospital. Summary Hemoptysis is coughing up blood. Coughing up 1-2 cups (240-480 mL) of blood within 24 hours is a medical emergency. Do not use any products that contain nicotine or tobacco. These products include cigarettes, chewing tobacco, and vaping devices, such as e-cigarettes. If you need help quitting, ask your health care provider. This information is not intended to replace advice given to you by your health care provider. Make sure you discuss any questions you have with your health care provider. Document Revised: 04/18/2021 Document Reviewed: 04/18/2021 Elsevier Patient Education  2024 [employer].

## 2023-09-27 NOTE — Assessment & Plan Note (Signed)
Chronic, stable.Continue  felodipine 5 mg, telmisartan 80 mg

## 2023-09-27 NOTE — Assessment & Plan Note (Addendum)
 Patient is stable without shortness of breath, tachycardia, tachypnea.  No evidence of active bleed on exam today.  Abdomen is soft.  No bleeding nasal lesions.  Remote history of smoking.  Discussed recent history of cough and suspect bronchitis is most likely the culprit.  In the setting of somewhat chronic cough, referral to pulmonology for further evaluation and also for evaluation of hemoptysis.  Pending chest x-ray.  Advised over-the-counter Mucinex DM and to stay very vigilant.  Strict return precautions to emergency room if patient were to see frank amount of bright red blood. Discussed use of alcohol and risk of PUD, gastritis.  She has already stopped drinking alcohol and encouraged her to do the same.  Patient will remain on omeprazole  over-the-counter. Consider CT Chest.

## 2023-09-27 NOTE — Progress Notes (Addendum)
 Assessment & Plan:  Hemoptysis Assessment & Plan: Patient is stable without shortness of breath, tachycardia, tachypnea.  No evidence of active bleed on exam today.  Abdomen is soft.  No bleeding nasal lesions.  Remote history of smoking.  Discussed recent history of cough and suspect bronchitis is most likely the culprit.  In the setting of somewhat chronic cough, referral to pulmonology for further evaluation and also for evaluation of hemoptysis.  Pending chest x-ray.  Advised over-the-counter Mucinex DM and to stay very vigilant.  Strict return precautions to emergency room if patient were to see frank amount of bright red blood. Discussed use of alcohol and risk of PUD, gastritis.  She has already stopped drinking alcohol and encouraged her to do the same.  Patient will remain on omeprazole  over-the-counter. Consider CT Chest.   Orders: -     CBC with Differential/Platelet -     Protime-INR -     Urinalysis, Routine w reflex microscopic -     DG Chest 2 View; Future  Primary hypertension Assessment & Plan: Chronic, stable.Continue  felodipine  5 mg, telmisartan  80 mg  Orders: -     Comprehensive metabolic panel -     TSH  Encounter for screening mammogram for malignant neoplasm of breast -     3D Screening Mammogram, Left and Right; Future  Pure hypercholesterolemia -     Comprehensive metabolic panel -     Lipid panel  History of prediabetes -     Hemoglobin A1c  History of vitamin D  deficiency -     VITAMIN D  25 Hydroxy (Vit-D Deficiency, Fractures)     Return precautions given.   Risks, benefits, and alternatives of the medications and treatment plan prescribed today were discussed, and patient expressed understanding.   Education regarding symptom management and diagnosis given to patient on AVS either electronically or printed.  No follow-ups on file.  Rollene Northern, FNP  Subjective:    Patient ID: Ronal LELON Kanaris, female    DOB: 04-21-1961, 63 y.o.   MRN:  [id number]  CC: JACCI RUBERG is a 63 y.o. female who presents today for follow up.   HPI: Complains of episodic hemoptysis x 3 days BRB in sputum which covered the kleenex.  The following coughing spells, less blood seen. The subsequent cough, even less blood.  The following day small streaks of brown in the sputum.  Scant BRB in sputum yesterday which was size of half dollar.   She describes 'bad coughing fits'.  She reports having a cold since Thanksgiving.  Grandchildren and husband have had a similar cough as well.  Cough is improved over the last couple weeks.   Symptom associated with cough   Denies CP, palpitations, epistaxis hematochezia, abdominal pain, sinus pain, ear pain.   No use of nasal spray.   remote history of smoking   She notes chronic cough over the years, previously on ACE inhibitor.  Cough improved with cessation.  She started over-the-counter omeprazole  with 90% improvement' of cough.   Denies NSAID, aspirin use  Drinks one bottle of wine per day. She has started 'dry' January. Denies eye opener.   Denies blacking out from drinking.    Colonoscopy 12/12/2019  She is no longer using Xanax    Allergies: Codeine and Methotrexate derivatives Current Outpatient Medications on File Prior to Visit  Medication Sig Dispense Refill  . felodipine  (PLENDIL ) 5 MG 24 hr tablet TAKE 1 TABLET BY MOUTH DAILY 90 tablet 0  . omeprazole  (  PRILOSEC OTC) 20 MG tablet Take 20 mg by mouth daily.    . rosuvastatin  (CRESTOR ) 20 MG tablet TAKE 1 TABLET EVERY EVENING 5 DAYS PER WEEK 20 tablet 8  . sertraline  (ZOLOFT ) 100 MG tablet TAKE 1 TABLET BY MOUTH EVERYDAY AT BEDTIME 30 tablet 17  . telmisartan  (MICARDIS ) 80 MG tablet Take 1 tablet (80 mg total) by mouth daily. 30 tablet 4  . VITAMIN D , CHOLECALCIFEROL , PO Take 800 Units by mouth daily.     No current facility-administered medications on file prior to visit.    Review of Systems  Constitutional:  Negative for chills  and fever.  HENT:  Positive for congestion.   Respiratory:  Positive for cough. Negative for shortness of breath and wheezing.   Cardiovascular:  Negative for chest pain and palpitations.  Gastrointestinal:  Negative for abdominal pain, diarrhea, nausea and vomiting.      Objective:    BP 132/78   Pulse 70   Temp 97.8 F (36.6 C) (Oral)   Ht 5' 10 (1.778 m)   Wt 174 lb 9.6 oz (79.2 kg)   SpO2 98%   BMI 25.05 kg/m  BP Readings from Last 3 Encounters:  09/27/23 132/78  10/06/22 (!) 150/94  02/21/22 122/80   Wt Readings from Last 3 Encounters:  09/27/23 174 lb 9.6 oz (79.2 kg)  10/06/22 184 lb 12.8 oz (83.8 kg)  02/21/22 197 lb 12.8 oz (89.7 kg)    Physical Exam Vitals reviewed.  Constitutional:      Appearance: Normal appearance. She is well-developed.  HENT:     Head: Normocephalic and atraumatic.     Right Ear: Hearing, tympanic membrane, ear canal and external ear normal. No decreased hearing noted. No drainage, swelling or tenderness. No middle ear effusion. No foreign body. Tympanic membrane is not erythematous or bulging.     Left Ear: Hearing, tympanic membrane, ear canal and external ear normal. No decreased hearing noted. No drainage, swelling or tenderness.  No middle ear effusion. No foreign body. Tympanic membrane is not erythematous or bulging.     Nose: Nose normal. No nasal tenderness, mucosal edema or rhinorrhea.     Right Nostril: No foreign body or epistaxis.     Left Nostril: No foreign body or epistaxis.     Right Sinus: No maxillary sinus tenderness or frontal sinus tenderness.     Left Sinus: No maxillary sinus tenderness or frontal sinus tenderness.     Comments: No bleeding nasal lesions.    Mouth/Throat:     Pharynx: Uvula midline. No oropharyngeal exudate or posterior oropharyngeal erythema.     Tonsils: No tonsillar abscesses.  Eyes:     Conjunctiva/sclera: Conjunctivae normal.  Cardiovascular:     Rate and Rhythm: Normal rate and regular  rhythm.     Pulses: Normal pulses.     Heart sounds: Normal heart sounds.  Pulmonary:     Effort: Pulmonary effort is normal.     Breath sounds: Normal breath sounds. No wheezing, rhonchi or rales.  Abdominal:     General: Bowel sounds are normal. There is no distension.     Palpations: Abdomen is soft. Abdomen is not rigid. There is no fluid wave or mass.     Tenderness: There is no abdominal tenderness. There is no guarding or rebound.  Lymphadenopathy:     Head:     Right side of head: No submental, submandibular, tonsillar, preauricular, posterior auricular or occipital adenopathy.     Left side of  head: No submental, submandibular, tonsillar, preauricular, posterior auricular or occipital adenopathy.     Cervical: No cervical adenopathy.  Skin:    General: Skin is warm and dry.  Neurological:     Mental Status: She is alert.  Psychiatric:        Speech: Speech normal.        Behavior: Behavior normal.        Thought Content: Thought content normal.

## 2023-09-28 NOTE — Addendum Note (Signed)
 Addended by: Jarvis Morgan D on: 09/28/2023 03:31 PM   Modules accepted: Orders

## 2023-10-02 ENCOUNTER — Telehealth: Payer: Self-pay | Admitting: Family

## 2023-10-02 NOTE — Telephone Encounter (Signed)
 Spoke to pt and she is understanding of the shortage nationwide she just would like to be sure nothing is wrong on the xray that was done.

## 2023-10-02 NOTE — Telephone Encounter (Signed)
 Patient  is calling in regarding her scan she had done chest xray and she is needing a referral to a pulmonologist she would like a call back today regarding these issues

## 2023-10-03 ENCOUNTER — Other Ambulatory Visit: Payer: Self-pay | Admitting: Family

## 2023-10-03 ENCOUNTER — Telehealth: Payer: Self-pay | Admitting: Family

## 2023-10-03 ENCOUNTER — Encounter: Payer: Self-pay | Admitting: Family

## 2023-10-03 DIAGNOSIS — R899 Unspecified abnormal finding in specimens from other organs, systems and tissues: Secondary | ICD-10-CM

## 2023-10-03 NOTE — Telephone Encounter (Signed)
 Spoke to Radiology and they are having someone to push xray through to be read

## 2023-10-03 NOTE — Telephone Encounter (Signed)
 close

## 2023-10-03 NOTE — Telephone Encounter (Signed)
 Called [facility] and they are having someone read xray to push through

## 2023-10-04 ENCOUNTER — Encounter: Payer: Self-pay | Admitting: Family

## 2023-10-08 ENCOUNTER — Other Ambulatory Visit: Payer: Self-pay | Admitting: Family

## 2023-10-08 ENCOUNTER — Telehealth: Payer: Self-pay | Admitting: Family

## 2023-10-08 DIAGNOSIS — R042 Hemoptysis: Secondary | ICD-10-CM

## 2023-10-08 NOTE — Telephone Encounter (Signed)
Lft pt vm to call ofc to sch STAT CT. thanks 

## 2023-10-08 NOTE — Telephone Encounter (Signed)
 Rasheedah Ordered stat CT chest  Can we arrange in the next day or so?

## 2023-10-09 NOTE — Telephone Encounter (Signed)
 noted

## 2023-10-10 ENCOUNTER — Ambulatory Visit
Admission: RE | Admit: 2023-10-10 | Discharge: 2023-10-10 | Disposition: A | Discharge status: Home / Self Care | Payer: 59 | Source: Ambulatory Visit | Attending: Family | Admitting: Family

## 2023-10-10 DIAGNOSIS — R042 Hemoptysis: Secondary | ICD-10-CM | POA: Insufficient documentation

## 2023-10-10 MED ORDER — IOHEXOL 300 MG/ML  SOLN
75.0000 mL | Freq: Once | INTRAMUSCULAR | Status: AC | PRN
  Administered 2023-10-10: 75 mL via INTRAVENOUS

## 2023-10-11 ENCOUNTER — Encounter: Payer: Self-pay | Admitting: Family

## 2023-10-11 DIAGNOSIS — I700 Atherosclerosis of aorta: Secondary | ICD-10-CM | POA: Insufficient documentation

## 2023-10-12 ENCOUNTER — Encounter: Payer: Self-pay | Admitting: Family

## 2023-10-12 ENCOUNTER — Other Ambulatory Visit: Payer: Self-pay | Admitting: Family

## 2023-10-12 DIAGNOSIS — R042 Hemoptysis: Secondary | ICD-10-CM

## 2023-10-17 ENCOUNTER — Ambulatory Visit: Payer: 59 | Admitting: Pulmonary Disease

## 2023-10-17 ENCOUNTER — Other Ambulatory Visit
Admission: RE | Admit: 2023-10-17 | Discharge: 2023-10-17 | Disposition: A | Discharge status: Home / Self Care | Payer: 59 | Source: Ambulatory Visit | Attending: Pulmonary Disease | Admitting: Pulmonary Disease

## 2023-10-17 ENCOUNTER — Encounter: Payer: Self-pay | Admitting: Pulmonary Disease

## 2023-10-17 ENCOUNTER — Telehealth: Payer: Self-pay | Admitting: Family

## 2023-10-17 VITALS — BP 124/82 | HR 97 | Temp 97.7°F | Ht 70.0 in | Wt 175.4 lb

## 2023-10-17 DIAGNOSIS — R053 Chronic cough: Secondary | ICD-10-CM | POA: Diagnosis present

## 2023-10-17 DIAGNOSIS — I341 Nonrheumatic mitral (valve) prolapse: Secondary | ICD-10-CM | POA: Diagnosis not present

## 2023-10-17 LAB — NITRIC OXIDE: Nitric Oxide: 67

## 2023-10-17 MED ORDER — BUDESONIDE-FORMOTEROL FUMARATE 160-4.5 MCG/ACT IN AERO: 2.0000 | INHALATION_SPRAY | Freq: Two times a day (BID) | RESPIRATORY_TRACT | 12 refills | Status: AC

## 2023-10-17 MED ORDER — ALBUTEROL SULFATE HFA 108 (90 BASE) MCG/ACT IN AERS: 2.0000 | INHALATION_SPRAY | Freq: Four times a day (QID) | RESPIRATORY_TRACT | 2 refills | Status: AC | PRN

## 2023-10-17 NOTE — Telephone Encounter (Signed)
Lft pt vm to call ofc to sch CT. thanks 

## 2023-10-19 LAB — ALLERGEN PANEL (27) + IGE
Alternaria Alternata IgE: <0.1 kU/L
Aspergillus Fumigatus IgE: <0.1 kU/L
Bahia Grass IgE: 0.8 kU/L — AB
Bermuda Grass IgE: 0.79 kU/L — AB
Cat Dander IgE: 49 kU/L — AB
Cedar, Mountain IgE: 0.33 kU/L — AB
Cladosporium Herbarum IgE: <0.1 kU/L
Cocklebur IgE: 0.44 kU/L — AB
Cockroach, American IgE: <0.1 kU/L
Common Silver Birch IgE: 0.11 kU/L — AB
D Farinae IgE: 7.94 kU/L — AB
D Pteronyssinus IgE: 9.83 kU/L — AB
Dog Dander IgE: 6.28 kU/L — AB
Elm, American IgE: 0.6 kU/L — AB
Hickory, White IgE: 0.31 kU/L — AB
IgE (Immunoglobulin E), Serum: 678 [IU]/mL — ABNORMAL HIGH (ref 6–495)
Johnson Grass IgE: 0.7 kU/L — AB
Kentucky Bluegrass IgE: 0.72 kU/L — AB
Maple/Box Elder IgE: 0.44 kU/L — AB
Mucor Racemosus IgE: 0.16 kU/L — AB
Oak, White IgE: 0.53 kU/L — AB
Penicillium Chrysogen IgE: <0.1 kU/L
Pigweed, Rough IgE: 0.27 kU/L — AB
Plantain, English IgE: 0.19 kU/L — AB
Ragweed, Short IgE: 0.33 kU/L — AB
Setomelanomma Rostrat: <0.1 kU/L
Timothy Grass IgE: 0.7 kU/L — AB
White Mulberry IgE: 0.26 kU/L — AB

## 2023-10-21 ENCOUNTER — Encounter: Payer: Self-pay | Admitting: Pulmonary Disease

## 2023-10-21 NOTE — Progress Notes (Signed)
Synopsis: Referred in by Allegra Grana, FNP   Subjective:   PATIENT ID: Danielle Humphrey GENDER: female DOB: 05-19-1961, MRN: [id number]  Chief Complaint  Patient presents with   Consult    Chronic Cough for years. On 12/30 she started coughing up blood. Has not coughed up blood in a week. Started taking Mucinex on 1/2 it has helped with the cough.    HPI Danielle Humphrey is a 63 years old female with a past medical history of hypertension HLD and GERD presenting today to the pulmonary clinic as a referral from her PCP for ongoing chronic cough and an episode of hemoptysis.   She reports she has been struggling with a dry cough for months now. This is mostly exacerbated by a viral infection. She denies any previous dx of asthma.   She denies any hypersensitivity to cold air. But does report that strong scents make her feel dizzy.   It was thought to be secondary to gerd, started on PPI with only minimal improvement.   Recently with an exacerbation of her cough leading to couple episodes of hemoptysis.   CT chest 10/10/2023 - Scatterd GGO most notably in the RUL. No signs of endobronchial disease.   FH - no family history of asthma   SH - Never smoker and does not vape, drinks 3 to 4 drinks per week. Has 1 cat at home.   ROS All systems were reviewed and are negative except for the above.  Objective:   Vitals:   10/17/23 1343  BP: 124/82  Pulse: 97  Temp: 97.7 F (36.5 C)  SpO2: 96%  Weight: 175 lb 6.4 oz (79.6 kg)  Height: 5\' 10"  (1.778 m)   96% on  RA BMI Readings from Last 3 Encounters:  10/17/23 25.17 kg/m  09/27/23 25.05 kg/m  10/06/22 26.52 kg/m   Wt Readings from Last 3 Encounters:  10/17/23 175 lb 6.4 oz (79.6 kg)  09/27/23 174 lb 9.6 oz (79.2 kg)  10/06/22 184 lb 12.8 oz (83.8 kg)    Physical Exam GEN: NAD, Healthy Appearing HEENT: Supple Neck, Reactive Pupils, EOMI  CVS: Normal S1, Normal S2, RRR, No murmurs or ES appreciated  Lungs: Clear bilateral  air entry.  Abdomen: Soft, non tender, non distended, + BS  Extremities: Warm and well perfused, No edema  Skin: No suspicious lesions appreciated  Psych: Normal Affect  FENO - 67  EOS - 300   Ancillary Information   CBC    Component Value Date/Time   WBC 9.0 09/27/2023 0919   RBC 4.60 09/27/2023 0919   HGB 14.3 09/27/2023 0919   HGB 13.5 11/03/2013 1245   HCT 43.3 09/27/2023 0919   HCT 41.0 11/03/2013 1245   PLT 249.0 09/27/2023 0919   PLT 259 11/03/2013 1245   MCV 94.2 09/27/2023 0919   MCV 91 11/03/2013 1245   MCH 30.3 05/21/2017 1646   MCHC 33.0 09/27/2023 0919   RDW 13.0 09/27/2023 0919   RDW 12.3 11/03/2013 1245   LYMPHSABS 2.1 09/27/2023 0919   MONOABS 0.5 09/27/2023 0919   EOSABS 0.3 09/27/2023 0919   BASOSABS 0.0 09/27/2023 0919   Labs and imaging were reviewed.      No data to display           Assessment & Plan:  Danielle Humphrey is a 63 years old female with a past medical history of hypertension HLD and GERD presenting today to the pulmonary clinic as a referral from her PCP for  ongoing chronic cough and an episode of hemoptysis.  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 This lead to an episode of hemoptysis from bronchial injury which has now subsided.   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   I will obtain PFTs to assess lung function, repeat a CT chest in 3 months to evaluated resolution of GGO and obtain allergen panel and total IgE. I will trial her on ICS-LABA and assess response in 3 months.   Plan  []  PFTs  []  Allergen panel and total IgE  []  Repeat CT chest wo con in 3 months  []  Echocardiogram.  []  Start budesonide-Formoterol [Symbicort] 160-4.5 2puffs BID. Advised mouth rinsing afterwards.  []  C/w Albuterol as needed.     Return in about 3 months (around 01/15/2024).  I  spent 60 minutes caring for this patient today, including preparing to see the patient, obtaining a medical history , reviewing a separately obtained history, performing a medically appropriate examination and/or evaluation, counseling and educating the patient/family/caregiver, ordering medications, tests, or procedures, documenting clinical information in the electronic health record, and independently interpreting results (not separately reported/billed) and communicating results to the patient/family/caregiver  Janann Colonel, MD [facility] 10/21/2023 2:50 PM

## 2023-11-02 ENCOUNTER — Other Ambulatory Visit (INDEPENDENT_AMBULATORY_CARE_PROVIDER_SITE_OTHER): Payer: 59

## 2023-11-02 DIAGNOSIS — R899 Unspecified abnormal finding in specimens from other organs, systems and tissues: Secondary | ICD-10-CM | POA: Diagnosis not present

## 2023-11-02 LAB — T3, FREE: T3, Free: 2.8 pg/mL (ref 2.3–4.2)

## 2023-11-02 LAB — PROTIME-INR
INR: 1 {ratio} (ref 0.8–1.0)
Prothrombin Time: 10.7 s (ref 9.6–13.1)

## 2023-11-02 LAB — T4, FREE: Free T4: 0.62 ng/dL (ref 0.60–1.60)

## 2023-11-02 LAB — TSH: TSH: 3.6 u[IU]/mL (ref 0.35–5.50)

## 2023-11-06 ENCOUNTER — Encounter: Payer: Self-pay | Admitting: Family

## 2023-11-07 ENCOUNTER — Telehealth: Payer: Self-pay | Admitting: Pulmonary Disease

## 2023-11-07 ENCOUNTER — Other Ambulatory Visit: Payer: Self-pay | Admitting: Family

## 2023-11-07 ENCOUNTER — Ambulatory Visit: Payer: 59

## 2023-11-07 DIAGNOSIS — R55 Syncope and collapse: Secondary | ICD-10-CM

## 2023-11-07 NOTE — Telephone Encounter (Signed)
Dr. Larinda Buttery you saw this patient on 10/17/23 and placed and order for the patient to do an echo. The patient's insurance would not approved the code 16109 which is the 3D part of the echo. Patient has CXL her appt for now. Last echo was done 11/18/2021. Would it be ok just to do the code 60454 which is the echo and [phone] 2D part of the echo?

## 2023-11-30 ENCOUNTER — Other Ambulatory Visit: Payer: Self-pay

## 2023-11-30 DIAGNOSIS — I10 Essential (primary) hypertension: Secondary | ICD-10-CM

## 2023-11-30 MED ORDER — TELMISARTAN 80 MG PO TABS: 80.0000 mg | ORAL_TABLET | Freq: Every day | ORAL | 4 refills | Status: DC

## 2023-11-30 NOTE — Telephone Encounter (Signed)
 Refill sent for Telmisartan 80 mg

## 2023-12-12 NOTE — Progress Notes (Signed)
 Unable to reach patient to schedule echocardiogram. Letter sent.

## 2024-01-09 ENCOUNTER — Other Ambulatory Visit: Payer: Self-pay | Admitting: Family

## 2024-01-09 DIAGNOSIS — E7800 Pure hypercholesterolemia, unspecified: Secondary | ICD-10-CM

## 2024-01-10 ENCOUNTER — Ambulatory Visit: Admission: RE | Admit: 2024-01-10 | Payer: 59 | Source: Ambulatory Visit

## 2024-02-07 ENCOUNTER — Ambulatory Visit: Payer: 59 | Admitting: Pulmonary Disease

## 2024-02-29 ENCOUNTER — Other Ambulatory Visit: Payer: Self-pay

## 2024-02-29 DIAGNOSIS — I10 Essential (primary) hypertension: Secondary | ICD-10-CM

## 2024-02-29 MED ORDER — TELMISARTAN 80 MG PO TABS: 80.0000 mg | ORAL_TABLET | Freq: Every day | ORAL | 2 refills | Status: DC

## 2024-03-19 NOTE — Telephone Encounter (Signed)
 I spoke with the patient today and she stated she would wait until she sees the cardiologist

## 2024-05-06 ENCOUNTER — Other Ambulatory Visit: Payer: Self-pay | Admitting: Family

## 2024-05-06 DIAGNOSIS — F322 Major depressive disorder, single episode, severe without psychotic features: Secondary | ICD-10-CM

## 2024-05-06 NOTE — Telephone Encounter (Signed)
 LVM to call back to office to schedule f/up with Danielle Humphrey, please schedule when pt calls back and document

## 2024-05-06 NOTE — Telephone Encounter (Signed)
 Pt has been scheduled for f/up on 06/06/24

## 2024-05-21 ENCOUNTER — Encounter (HOSPITAL_COMMUNITY): Payer: Self-pay

## 2024-05-21 ENCOUNTER — Other Ambulatory Visit: Payer: Self-pay

## 2024-05-21 ENCOUNTER — Emergency Department (HOSPITAL_COMMUNITY)

## 2024-05-21 ENCOUNTER — Emergency Department (HOSPITAL_COMMUNITY)
Admission: EM | Admit: 2024-05-21 | Discharge: 2024-05-21 | Disposition: A | Discharge status: Home / Self Care | Attending: Emergency Medicine | Admitting: Emergency Medicine

## 2024-05-21 DIAGNOSIS — I10 Essential (primary) hypertension: Secondary | ICD-10-CM | POA: Insufficient documentation

## 2024-05-21 DIAGNOSIS — R569 Unspecified convulsions: Secondary | ICD-10-CM | POA: Insufficient documentation

## 2024-05-21 LAB — CBC WITH DIFFERENTIAL/PLATELET
Abs Immature Granulocytes: 0.08 K/uL — ABNORMAL HIGH (ref 0.00–0.07)
Basophils Absolute: 0 K/uL (ref 0.0–0.1)
Basophils Relative: 0 %
Eosinophils Absolute: 0.1 K/uL (ref 0.0–0.5)
Eosinophils Relative: 1 %
HCT: 38.2 % (ref 36.0–46.0)
Hemoglobin: 12.5 g/dL (ref 12.0–15.0)
Immature Granulocytes: 1 %
Lymphocytes Relative: 13 %
Lymphs Abs: 1.6 K/uL (ref 0.7–4.0)
MCH: 30.7 pg (ref 26.0–34.0)
MCHC: 32.7 g/dL (ref 30.0–36.0)
MCV: 93.9 fL (ref 80.0–100.0)
Monocytes Absolute: 0.5 K/uL (ref 0.1–1.0)
Monocytes Relative: 4 %
Neutro Abs: 10.4 K/uL — ABNORMAL HIGH (ref 1.7–7.7)
Neutrophils Relative %: 81 %
Platelets: 242 K/uL (ref 150–400)
RBC: 4.07 MIL/uL (ref 3.87–5.11)
RDW: 12.6 % (ref 11.5–15.5)
WBC: 12.7 K/uL — ABNORMAL HIGH (ref 4.0–10.5)
nRBC: 0 % (ref 0.0–0.2)

## 2024-05-21 LAB — COMPREHENSIVE METABOLIC PANEL WITH GFR
ALT: 17 U/L (ref 0–44)
AST: 23 U/L (ref 15–41)
Albumin: 3.9 g/dL (ref 3.5–5.0)
Alkaline Phosphatase: 33 U/L — ABNORMAL LOW (ref 38–126)
Anion gap: 11 (ref 5–15)
BUN: 15 mg/dL (ref 8–23)
CO2: 22 mmol/L (ref 22–32)
Calcium: 9 mg/dL (ref 8.9–10.3)
Chloride: 102 mmol/L (ref 98–111)
Creatinine, Ser: 0.96 mg/dL (ref 0.44–1.00)
GFR, Estimated: >60 mL/min (ref >60)
Glucose, Bld: 147 mg/dL — ABNORMAL HIGH (ref 70–99)
Potassium: 3.3 mmol/L — ABNORMAL LOW (ref 3.5–5.1)
Sodium: 135 mmol/L (ref 135–145)
Total Bilirubin: 0.5 mg/dL (ref 0.0–1.2)
Total Protein: 6.8 g/dL (ref 6.5–8.1)

## 2024-05-21 MED ORDER — PROCHLORPERAZINE EDISYLATE 10 MG/2ML IJ SOLN
5.0000 mg | Freq: Once | INTRAMUSCULAR | Status: AC
  Administered 2024-05-21: 5 mg via INTRAVENOUS
  Filled 2024-05-21: qty 2

## 2024-05-21 MED ORDER — DIPHENHYDRAMINE HCL 50 MG/ML IJ SOLN
25.0000 mg | Freq: Once | INTRAMUSCULAR | Status: AC
  Administered 2024-05-21: 25 mg via INTRAVENOUS
  Filled 2024-05-21: qty 1

## 2024-05-21 MED ORDER — LEVETIRACETAM 500 MG PO TABS: 500.0000 mg | ORAL_TABLET | Freq: Two times a day (BID) | ORAL | 0 refills | Status: DC

## 2024-05-21 MED ORDER — ACETAMINOPHEN 500 MG PO TABS
1000.0000 mg | ORAL_TABLET | Freq: Once | ORAL | Status: AC
  Administered 2024-05-21: 1000 mg via ORAL
  Filled 2024-05-21: qty 2

## 2024-05-21 MED ORDER — LEVETIRACETAM (KEPPRA) 500 MG/5 ML ADULT IV PUSH
1000.0000 mg | Freq: Once | INTRAVENOUS | Status: AC
  Administered 2024-05-21: 1000 mg via INTRAVENOUS
  Filled 2024-05-21: qty 10

## 2024-05-21 NOTE — ED Provider Notes (Signed)
 [place] [facility] Provider Note   CSN: [id number] Arrival date & time: 05/21/24  9562     History Chief Complaint  Patient presents with   Seizures    HPI Danielle Humphrey is a 63 y.o. female presenting for chief complaint of seizures.  Minimal medical history.  History of hypertension.  Compliant with medications.  No new medications. Husband states that he noticed that she was shaking violently in bed.  Turned on the lights and it was full body shakes.  Lasted approximately 2 minutes.  EMS was called.  She was unresponsive initially but gradually return to her baseline.  Nauseous after the event having diffuse headache. No history of similar events.  Denies fevers chills vomiting syncope shortness of breath otherwise.   Patient's recorded medical, surgical, social, medication list and allergies were reviewed in the Snapshot window as part of the initial history.   Review of Systems   Review of Systems  Constitutional:  Negative for chills and fever.  HENT:  Negative for ear pain and sore throat.   Eyes:  Negative for pain and visual disturbance.  Respiratory:  Negative for cough and shortness of breath.   Cardiovascular:  Negative for chest pain and palpitations.  Gastrointestinal:  Positive for nausea. Negative for abdominal pain and vomiting.  Genitourinary:  Negative for dysuria and hematuria.  Musculoskeletal:  Negative for arthralgias and back pain.  Skin:  Negative for color change and rash.  Neurological:  Positive for seizures and headaches. Negative for syncope.  All other systems reviewed and are negative.   Physical Exam Updated Vital Signs BP (!) 144/99 (BP Location: Right Arm)   Pulse 76   Temp (!) 97.4 F (36.3 C) (Oral)   Resp 20   Ht 5' 11 (1.803 m)   Wt 72.6 kg   SpO2 93%   BMI 22.32 kg/m  Physical Exam Vitals and nursing note reviewed.  Constitutional:      General: She is not in acute distress.     Appearance: She is well-developed. She is not ill-appearing or toxic-appearing.  HENT:     Head: Normocephalic and atraumatic.  Eyes:     Extraocular Movements: Extraocular movements intact.     Conjunctiva/sclera: Conjunctivae normal.     Pupils: Pupils are equal, round, and reactive to light.  Cardiovascular:     Rate and Rhythm: Normal rate and regular rhythm.     Heart sounds: No murmur heard. Pulmonary:     Effort: Pulmonary effort is normal. No respiratory distress.     Breath sounds: Normal breath sounds.  Abdominal:     General: Abdomen is flat. There is no distension.     Palpations: Abdomen is soft.     Tenderness: There is no abdominal tenderness. There is no right CVA tenderness or left CVA tenderness.  Musculoskeletal:        General: No swelling, tenderness, deformity or signs of injury. Normal range of motion.     Cervical back: Normal range of motion and neck supple. No rigidity.  Skin:    General: Skin is warm and dry.  Neurological:     General: No focal deficit present.     Mental Status: She is alert and oriented to person, place, and time. Mental status is at baseline.     Cranial Nerves: No cranial nerve deficit.  Psychiatric:        Mood and Affect: Mood normal.      ED Course/  Medical Decision Making/ A&P    Procedures Procedures   Medications Ordered in ED Medications  prochlorperazine  (COMPAZINE ) injection 5 mg (has no administration in time range)  diphenhydrAMINE  (BENADRYL ) injection 25 mg (has no administration in time range)  levETIRAcetam  (KEPPRA ) undiluted injection 1,000 mg (1,000 mg Intravenous Given 05/21/24 0546)  acetaminophen  (TYLENOL ) tablet 1,000 mg (1,000 mg Oral Given 05/21/24 0547)    Medical Decision Making:   Danielle Humphrey is a 63 y.o. female who presented to the ED today with seizure event and headache detailed above.    Patient placed on continuous vitals and telemetry monitoring while in ED which was reviewed periodically.   Complete initial physical exam performed, notably the patient  was hemodynamically stable no acute distress.    Reviewed and confirmed nursing documentation for past medical history, family history, social history.    Initial Assessment:   With the patient's presentation of seizure event, most likely diagnosis is new onset seizure disorder, metabolic crisis, nonspecific event. Other diagnoses were considered including (but not limited to) intracranial hemorrhage, intracranial mass, metabolic crisis. These are considered less likely due to history of present illness and physical exam findings.   This is most consistent with an acute life/limb threatening illness complicated by underlying chronic conditions.  Initial Plan:  CT brain Screening labs including CBC and Metabolic panel to evaluate for infectious or metabolic etiology of disease.  Urinalysis with reflex culture ordered to evaluate for UTI or relevant urologic/nephrologic pathology.  CXR to evaluate for structural/infectious intrathoracic pathology.  EKG to evaluate for cardiac pathology Objective evaluation as below reviewed   Initial Study Results:   Laboratory  All laboratory results reviewed without evidence of clinically relevant pathology.    EKG EKG was reviewed independently. Rate, rhythm, axis, intervals all examined and without medically relevant abnormality. ST segments without concerns for elevations.    Radiology:  All images reviewed independently. Agree with radiology report at this time.   CT HEAD WO CONTRAST ( ) Result Date: 05/21/2024 EXAM: CT HEAD WITHOUT CONTRAST 05/21/2024 05:06:39 AM TECHNIQUE: CT of the head was performed without the administration of intravenous contrast. Automated exposure control, iterative reconstruction, and/or weight based adjustment of the mA/kV was utilized to reduce the radiation dose to as low as reasonably achievable. COMPARISON: None available. CLINICAL HISTORY: Head trauma,  minor (Age >= 65y). from home due to seizure like activity. Patient and husband were in bed when husband noticed patient shaking and having seizure like movement. Lasting roughly 30-90 seconds. Patient has no hx of seizures. Per EMS patient appeared post-ictal upon arrival. Currently GCS of 15 however does not remember the event happening. FINDINGS: BRAIN AND VENTRICLES: No acute hemorrhage. Gray-white differentiation is preserved. No hydrocephalus. No mass effect or midline shift. There is age-related cerebral volume loss. There is a chronic lacunar infarct within the anterior limb of the right internal capsule. ORBITS: No acute abnormality. SINUSES: There is polypoid mucosal disease within the right maxillary sinus. There is also mucosal disease within the ethmoid air cells. SOFT TISSUES AND SKULL: There is an extraaxial calcified lesion overlying the left occipital lobe measuring approximately 11 x 8 x 12 mm, likely representing a calcified meningioma. IMPRESSION: 1. No acute intracranial abnormality related to the reported seizure-like activity or minor head trauma. 2. Extraaxial calcified lesion overlying the left occipital lobe, likely representing a calcified meningioma. 3. 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 4. Polypoid mucosal disease within the right maxillary sinus and mucosal  disease within the ethmoid air cells. Electronically signed by: Evalene Coho MD 05/21/2024 05:19 AM EDT RP Workstation: [id number]   Reassessment and Plan:   CT scan shows calcified meningioma relatively large size. Likely chronic disease.  Now causing local edema likely. This could explain her seizure disorder.  No metabolic crisis detected.  She was loaded with Keppra  and treated and her headache symptoms improved.  CT was performed within 6 hours of headache initiation therefore diagnostic sensitivity for acute intracranial hemorrhage is adequate to rule this condition  out. Symptoms improving on reassessment.  Patient stable to follow-up in the outpatient setting for neurologic care.   Disposition:  I have considered need for hospitalization, however, considering all of the above, I believe this patient is stable for discharge at this time.  Patient/family educated about specific return precautions for given chief complaint and symptoms.  Patient/family educated about follow-up with PCP.     Patient/family expressed understanding of return precautions and need for follow-up. Patient spoken to regarding all imaging and laboratory results and appropriate follow up for these results. All education provided in verbal form with additional information in written form. Time was allowed for answering of patient questions. Patient discharged.    Emergency Department Medication Summary:   Medications  prochlorperazine  (COMPAZINE ) injection 5 mg (has no administration in time range)  diphenhydrAMINE  (BENADRYL ) injection 25 mg (has no administration in time range)  levETIRAcetam  (KEPPRA ) undiluted injection 1,000 mg (1,000 mg Intravenous Given 05/21/24 0546)  acetaminophen  (TYLENOL ) tablet 1,000 mg (1,000 mg Oral Given 05/21/24 0547)         Clinical Impression:  1. Seizure (HCC)      Data Unavailable   Final Clinical Impression(s) / ED Diagnoses Final diagnoses:  Seizure (HCC)    Rx / DC Orders ED Discharge Orders          Ordered    levETIRAcetam  (KEPPRA ) 500 MG tablet  2 times daily        05/21/24 0555    Ambulatory referral to Neurology       Comments: An appointment is requested in approximately: 1 week   05/21/24 0555              Jerral Meth, MD 05/21/24 [phone]

## 2024-05-21 NOTE — ED Triage Notes (Signed)
 Patient BIB [facility] from home due to seizure like activity. Patient and husband were in bed when husband noticed patient shaking and having seizure like movement. Lasting roughly 30-90 seconds. Patient has no hx of seizures. Per EMS patient appeared post-ictal upon arrival. Currently GCS of 15 however does not remember the even happening. Received 4mg  zofran IV en route w/ EMS. VSS.

## 2024-06-01 NOTE — Progress Notes (Unsigned)
 [facility]  PATIENT: Danielle Humphrey DOB: 03/20/1961  REFERRING DOCTOR OR PCP:  Sidney Northern, FNP SOURCE: Patient, notes from emergency room, imaging and lab reports, CT scan personally reviewed  _________________________________   HISTORICAL  CHIEF COMPLAINT:  Chief Complaint  Patient presents with   RM11/SEIZURE    Pt is here with her Husband. Pt states that she has been extremely tired. Pt states she has some headaches as well as dizziness. Pt states she was coughing up blood in January.  Pt states she has some confusion.     HISTORY OF PRESENT ILLNESS:  I had the pleasure of seeing your patient, Danielle Humphrey, at [facility] for neurologic consultation regarding her seizure.  She is a 63 year old woman who had a normal evening and went to bed.   Her husband noted the bed was shaking.  She did not respond to her husband when he asked 'Are you ok'    The thrashing lasted 3-4 minutes.    He moved her over to her side and she was very groggy x 30 minutes then confused for the next 24 hours.    She had 3 bourbon and ginger ales - one ounce with each shot.   She reports drinking up to one bottle of wine a day and mixed drinks less commonly.    She still feels a little confused and more tired since .  She is now napping most days over the past week.   She notes she started a meal prep yesterday then forgot about it.      Although she drinks moderately, she was drinking at +/- her normal amount the week of the seizure.     She generally sleeps well and the night before she got 6-7 hours of sleep.      Sh presented to the ED and had a CT scan    that showed a small lacunar infarction in the anterior limb of the right internal capsule, mild generalized cortical atrophy and a calcified mass in the extra-axial left subdural region consistent with meningioma.  There were no acute findings.  She was placed on Keppra  and tolerates it well.  This is the only  seizure she has had.    She denies a h/o head trauma.     Vascular risks:  She is not diabetic.   She has MVR.  She has HTN (typically 140/90 - more aggressive treatment caused orthostatic lightheadedness)  She takes occasional dextromethorphan but none the previous couple of days     No FH of epilepsy/seizures.   Her mother had vascular dementia.  Father dies age 64 of CAD/MI  Laboratory: Lipid panel yesterday showed mildly elevated LDL and triglycerides.  She has seen cardiology and will be getting an echocardiogram.  Imaging: CT scan 05/21/2024 shows a calcified 7 to 8 mm mass in the left occipital region most compatible with a small meningioma.  Adjacent brain appears normal.  Brain volume was normal for age.  There is a remote lacunar infarction in the anterior limb of the right internal capsule and some other hypodensities consistent with chronic microvascular ischemic change.  REVIEW OF SYSTEMS: Constitutional: No fevers, chills, sweats, or change in appetite Eyes: No visual changes, double vision, eye pain Ear, nose and throat: No hearing loss, ear pain, nasal congestion, sore throat Cardiovascular: No chest pain, palpitations Respiratory:  No shortness of breath at rest or with exertion.   No wheezes GastrointestinaI: No nausea, vomiting,  diarrhea, abdominal pain, fecal incontinence Genitourinary:  No dysuria, urinary retention or frequency.  No nocturia. Musculoskeletal:  No neck pain, back pain Integumentary: No rash, pruritus, skin lesions Neurological: as above Psychiatric: No depression at this time.  No anxiety Endocrine: No palpitations, diaphoresis, change in appetite, change in weigh or increased thirst Hematologic/Lymphatic:  No anemia, purpura, petechiae. Allergic/Immunologic: No itchy/runny eyes, nasal congestion, recent allergic reactions, rashes  ALLERGIES: Allergies  Allergen Reactions   Codeine Anaphylaxis   Methotrexate Derivatives Anaphylaxis    HOME  MEDICATIONS:  Current Outpatient Medications:    albuterol  (VENTOLIN  HFA) 108 (90 Base) MCG/ACT inhaler, Inhale 2 puffs into the lungs every 6 (six) hours as needed for wheezing or shortness of breath., Disp: 8 g, Rfl: 2   budesonide -formoterol  (SYMBICORT ) 160-4.5 MCG/ACT inhaler, Inhale 2 puffs into the lungs in the morning and at bedtime., Disp: 1 each, Rfl: 12   felodipine  (PLENDIL ) 5 MG 24 hr tablet, TAKE 1 TABLET BY MOUTH DAILY, Disp: 90 tablet, Rfl: 3   levETIRAcetam  (KEPPRA ) 500 MG tablet, Take 1 tablet (500 mg total) by mouth 2 (two) times daily., Disp: 60 tablet, Rfl: 0   omeprazole  (PRILOSEC OTC) 20 MG tablet, Take 20 mg by mouth daily., Disp: , Rfl:    rosuvastatin  (CRESTOR ) 20 MG tablet, TAKE ONE TABLET BY MOUTH EVERY EVENING 5 DAYS PER WEEK., Disp: 20 tablet, Rfl: 8   sertraline  (ZOLOFT ) 100 MG tablet, TAKE 1 TABLET BY MOUTH EVERY NIGHT AT BEDTIME, Disp: 30 tablet, Rfl: 17   telmisartan  (MICARDIS ) 80 MG tablet, Take 1 tablet (80 mg total) by mouth daily., Disp: 30 tablet, Rfl: 2   VITAMIN D , CHOLECALCIFEROL , PO, Take 800 Units by mouth daily., Disp: , Rfl:    dextromethorphan-guaiFENesin (MUCINEX DM) 30-600 MG 12hr tablet, Take 1 tablet by mouth 2 (two) times daily. (Patient not taking: Reported on 06/03/2024), Disp: , Rfl:   PAST MEDICAL HISTORY: Past Medical History:  Diagnosis Date   Chickenpox 1960   Hypercholesteremia 2017   Hypertension 1980   Mitral valve prolapse 2016   Shingles 2016    PAST SURGICAL HISTORY: Past Surgical History:  Procedure Laterality Date   AMPUTATION FINGER Left 1981   third finger    APPENDECTOMY Bilateral 1968   BREAST EXCISIONAL BIOPSY Bilateral 2005   benign   BREAST LUMPECTOMY Bilateral 2005   COLONOSCOPY WITH PROPOFOL  N/A 12/12/2019   Procedure: COLONOSCOPY WITH PROPOFOL ;  Surgeon: Unk Corinn Skiff, MD;  Location: [facility] ENDOSCOPY;  Service: Gastroenterology;  Laterality: N/A;    FAMILY HISTORY: Family History  Problem Relation Age  of Onset   Breast cancer Mother 89       33   Dementia Mother    Stroke Mother    Lupus Mother    Hypercholesterolemia Father    Heart attack Father        17   Hypertension Father    Diabetes type II Sister    Heart disease Brother    Ovarian cancer Neg Hx     SOCIAL HISTORY: Social History   Socioeconomic History   Marital status: Married    Spouse name: Vincenta Steffey   Number of children: 2   Years of education: 2   Highest education level: Not on file  Occupational History   Occupation: Development worker, [employer]  Tobacco Use   Smoking status: Former    Current packs/day: 0.00    Types: Cigarettes    Quit date: 04/24/1985    Years since quitting: 39.1  Smokeless tobacco: Never   Tobacco comments:    Started smoking at 63 yrs old.    Smoked 1 PPD at her heaviest    Quit smoking in 1986  Vaping Use   Vaping status: Never Used  Substance and Sexual Activity   Alcohol use: Yes    Alcohol/week: 7.0 standard drinks of alcohol    Types: 3 Glasses of wine, 4 Cans of beer per week    Comment: bourbon and diet ginger ale, red wine 3 x per week.   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   General: The patient is well-developed and well-nourished and in no acute distress  HEENT:  Head is  C[place]/AT.  Sclera are anicteric.     Neck: No carotid bruits are noted.  The neck is nontender.  Cardiovascular: The heart has a regular rate and rhythm with a normal S1 and S2. There were no murmurs, gallops or rubs.    Skin: Extremities are without rash or  edema.  Musculoskeletal:  Back is nontender  Neurologic Exam  Mental status: The patient is alert and oriented x 3 at the time of the examination. The patient has apparent normal recent and remote memory, with an apparently normal attention span and concentration ability.   Speech is normal.  Cranial nerves: Extraocular movements are full. Pupils are equal, round, and reactive to light and accomodation.  Visual fields are full.  Facial symmetry is present. There is good facial sensation to soft touch bilaterally.Facial strength is normal.  Trapezius and sternocleidomastoid strength is normal. No dysarthria is noted.  The tongue is midline, and the patient has symmetric elevation of the soft palate. No obvious hearing deficits are noted.  Motor:  Muscle bulk is normal.   Tone is normal. Strength is  5 / 5 in all 4 extremities.   Sensory: Sensory testing is intact to pinprick, soft touch and vibration sensation in all 4 extremities.  Coordination: Cerebellar testing reveals good finger-nose-finger and heel-to-shin bilaterally.  Gait and station: Station is normal.   Gait is normal. Tandem gait is normal. Romberg is negative.   Reflexes: Deep tendon reflexes are symmetric and normal bilaterally.   Plantar responses are flexor.    DIAGNOSTIC DATA (LABS, IMAGING, TESTING) - I reviewed patient records, labs, notes, testing and imaging myself where available.  Lab Results  Component Value Date   WBC 12.7 (H) 05/21/2024   HGB 12.5 05/21/2024   HCT 38.2 05/21/2024   MCV 93.9 05/21/2024   PLT 242 05/21/2024      Component Value Date/Time   NA 135  05/21/2024 0458   NA 139 12/08/2020 1101   NA 138 11/03/2013 1245   K 3.3 (L)  05/21/2024 0458   K 3.3 (L) 11/03/2013 1245   CL 102 05/21/2024 0458   CL 105 11/03/2013 1245   CO2 22 05/21/2024 0458   CO2 24 11/03/2013 1245   GLUCOSE 147 (H) 05/21/2024 0458   GLUCOSE 95 11/03/2013 1245   BUN 15 05/21/2024 0458   BUN 16 12/08/2020 1101   BUN 15 11/03/2013 1245   CREATININE 0.96 05/21/2024 0458   CREATININE 0.88 11/27/2013 1340   CALCIUM  9.0 05/21/2024 0458   CALCIUM  9.7 11/03/2013 1245   PROT 6.8 05/21/2024 0458   PROT 7.7 11/03/2013 1245   ALBUMIN 3.9 05/21/2024 0458   ALBUMIN 4.1 11/03/2013 1245   AST 23 05/21/2024 0458   AST 27 11/03/2013 1245   ALT 17 05/21/2024 0458   ALT 21 11/03/2013 1245   ALKPHOS 33 (L) 05/21/2024 0458   ALKPHOS 34 (L) 11/03/2013 1245   BILITOT 0.5 05/21/2024 0458   BILITOT 0.4 11/03/2013 1245   GFRNONAA >60 05/21/2024 0458   GFRNONAA >60 11/03/2013 1245   GFRAA >60 11/03/2013 1245   Lab Results  Component Value Date   CHOL 187 06/02/2024   HDL 47 06/02/2024   LDLCALC 108 (H) 06/02/2024   LDLDIRECT 105.0 02/24/2022   TRIG 187 (H) 06/02/2024   CHOLHDL 4.0 06/02/2024   Lab Results  Component Value Date   HGBA1C 5.9 09/27/2023   No results found for: VITAMINB12 Lab Results  Component Value Date   TSH 3.60 11/02/2023       ASSESSMENT AND PLAN  Seizures (HCC) - Plan: EEG adult, MR BRAIN W WO CONTRAST  Lacunar stroke (HCC) - Plan: MR BRAIN W WO CONTRAST  Meningioma (HCC) - Plan: MR BRAIN W WO CONTRAST  Gait disturbance - Plan: MR BRAIN W WO CONTRAST   In summary, Ms. Osmundson is a 63 year old woman with the first seizure of her life 2 weeks ago.  It was a generalized tonic-clonic seizure with postictal confusion.  She still feels a little bit more tired though at this point she should have recovered from the seizure.  CT scan showed a calcified meningioma.  We discussed that these often will stop growing once they become heavily calcified but I would like to reimage this in about a year just to ensure stability.   We did check an EEG to assess for epileptiform activity.  Additionally we will check a MRI of the brain to better assess for structural etiology behind the seizure and to further evaluate the occipital mass.  She will remain on Keppra .  We discussed that she should not drive for 6 months from the time of her seizure.  She will return to see me in 1 year or sooner if there are new or worsening neurologic symptoms.  Further evaluation and treatment may be necessary based on the results of the EEG and MRI.  We also discussed that in about a year we will recheck an MRI to ensure stability of the probable meningioma.  She will remain on Keppra .  She has chronic alcohol use though has been able to stop periodically.  I advised her to try to drink less in general though because she did not change her alcohol intake the week of the seizure, it is uncertain if this played any role.  We did discuss that her tandem gait be wide could be related to her alcohol use and that reduction  or cessation would be best.  Thank you for asking me to see this patient.  Please let me know if I can be of further assistance with her or other patients in the future.  This visit is part of a comprehensive longitudinal care medical relationship regarding the patients primary diagnosis of seizure and related concerns.   Dmarco Baldus A. Vear, MD, [facility] 06/03/2024, 2:39 PM Certified in Neurology, Clinical Neurophysiology, Sleep Medicine and Neuroimaging  [facility] [address], [address] [place], [place] [address] [phone]

## 2024-06-02 ENCOUNTER — Ambulatory Visit: Attending: Cardiology | Admitting: Cardiology

## 2024-06-02 ENCOUNTER — Encounter: Payer: Self-pay | Admitting: Cardiology

## 2024-06-02 ENCOUNTER — Ambulatory Visit
Admission: RE | Admit: 2024-06-02 | Discharge: 2024-06-02 | Disposition: A | Discharge status: Home / Self Care | Source: Ambulatory Visit | Attending: Family

## 2024-06-02 VITALS — BP 140/83 | HR 58 | Ht 70.0 in | Wt 165.8 lb

## 2024-06-02 DIAGNOSIS — I10 Essential (primary) hypertension: Secondary | ICD-10-CM | POA: Diagnosis not present

## 2024-06-02 DIAGNOSIS — I340 Nonrheumatic mitral (valve) insufficiency: Secondary | ICD-10-CM | POA: Diagnosis not present

## 2024-06-02 DIAGNOSIS — R053 Chronic cough: Secondary | ICD-10-CM | POA: Diagnosis not present

## 2024-06-02 DIAGNOSIS — E782 Mixed hyperlipidemia: Secondary | ICD-10-CM

## 2024-06-02 DIAGNOSIS — I7121 Aneurysm of the ascending aorta, without rupture: Secondary | ICD-10-CM | POA: Diagnosis not present

## 2024-06-02 DIAGNOSIS — R918 Other nonspecific abnormal finding of lung field: Secondary | ICD-10-CM | POA: Diagnosis not present

## 2024-06-02 NOTE — Patient Instructions (Signed)
 Medication Instructions:  Your physician recommends that you continue on your current medications as directed. Please refer to the Current Medication list given to you today.   *If you need a refill on your cardiac medications before your next appointment, please call your pharmacy*  Lab Work: Your provider would like for you to have following labs drawn today lipid panel.   If you have labs (blood work) drawn today and your tests are completely normal, you will receive your results only by: MyChart Message (if you have MyChart) OR A paper copy in the mail If you have any lab test that is abnormal or we need to change your treatment, we will call you to review the results.  Testing/Procedures: Your physician has requested that you have an echocardiogram. Echocardiography is a painless test that uses sound waves to create images of your heart. It provides your doctor with information about the size and shape of your heart and how well your heart's chambers and valves are working.   You may receive an ultrasound enhancing agent through an IV if needed to better visualize your heart during the echo. This procedure takes approximately one hour.  There are no restrictions for this procedure.  This will take place at [address] [facility] [address], [place] [address]  Please note: We ask at that you not bring children with you during ultrasound (echo/ vascular) testing. Due to room size and safety concerns, children are not allowed in the ultrasound rooms during exams. Our front office staff cannot provide observation of children in our lobby area while testing is being conducted. An adult accompanying a patient to their appointment will only be allowed in the ultrasound room at the discretion of the ultrasound technician under special circumstances. We apologize for any inconvenience.   Follow-Up: At [facility], you and your health needs are our priority.  As part of our  continuing mission to provide you with exceptional heart care, our providers are all part of one team.  This team includes your primary Cardiologist (physician) and Advanced Practice Providers or APPs (Physician Assistants and Nurse Practitioners) who all work together to provide you with the care you need, when you need it.  Your next appointment:   6 month(s)  Provider:   You may see Redell Cave, MD or one of the following Advanced Practice Providers on your designated Care Team:   Lonni Meager, NP Lesley Maffucci, PA-C Bernardino Bring, PA-C Cadence [place], PA-C Tylene Lunch, NP Barnie Hila, NP    We recommend signing up for the patient portal called MyChart.  Sign up information is provided on this After Visit Summary.  MyChart is used to connect with patients for Virtual Visits (Telemedicine).  Patients are able to view lab/test results, encounter notes, upcoming appointments, etc.  Non-urgent messages can be sent to your provider as well.   To learn more about what you can do with MyChart, go to [email or web address].

## 2024-06-02 NOTE — Progress Notes (Signed)
 Cardiology Office Note:    Date:  06/02/2024   ID:  Danielle Humphrey 02-07-1961, MRN [id number]  PCP:  Dineen Rollene MATSU, FNP  Cardiologist:  Redell Cave, MD  Electrophysiologist:  None   Referring MD: Dineen Rollene MATSU, FNP   Chief Complaint  Patient presents with   80 MONTH FOLLOW-UP    Pt doing good.    History of Present Illness:    Danielle Humphrey is a 63 y.o. female with a hx of mild to moderate MR, hypertension, hyperlipidemia who presents for follow-up.    Doing okay, denies chest pain or shortness of breath.  Compliant with medications as prescribed.  Takes Crestor  5 days weekly, higher doses of Crestor  caused muscle aches.  Denies edema.  Recently diagnosed with seizure, started on Keppra .  Denies any concerns at this time.  Prior notes Echo 12/31/2020, normal systolic function, EF 60% mild to moderate MR Echocardiogram 12/16/2019 normal systolic function, EF 60% mild to moderate MR   Past Medical History:  Diagnosis Date   Chickenpox 1960   Hypercholesteremia 2017   Hypertension 1980   Mitral valve prolapse 2016   Shingles 2016    Past Surgical History:  Procedure Laterality Date   AMPUTATION FINGER Left 1981   third finger    APPENDECTOMY Bilateral 1968   BREAST EXCISIONAL BIOPSY Bilateral 2005   benign   BREAST LUMPECTOMY Bilateral 2005   COLONOSCOPY WITH PROPOFOL  N/A 12/12/2019   Procedure: COLONOSCOPY WITH PROPOFOL ;  Surgeon: Unk Corinn Skiff, MD;  Location: [facility] ENDOSCOPY;  Service: Gastroenterology;  Laterality: N/A;    Current Medications: Current Meds  Medication Sig   albuterol  (VENTOLIN  HFA) 108 (90 Base) MCG/ACT inhaler Inhale 2 puffs into the lungs every 6 (six) hours as needed for wheezing or shortness of breath.   budesonide -formoterol  (SYMBICORT ) 160-4.5 MCG/ACT inhaler Inhale 2 puffs into the lungs in the morning and at bedtime.   dextromethorphan-guaiFENesin (MUCINEX DM) 30-600 MG 12hr tablet Take 1 tablet by mouth 2 (two) times  daily.   felodipine  (PLENDIL ) 5 MG 24 hr tablet TAKE 1 TABLET BY MOUTH DAILY   levETIRAcetam  (KEPPRA ) 500 MG tablet Take 1 tablet (500 mg total) by mouth 2 (two) times daily.   omeprazole  (PRILOSEC OTC) 20 MG tablet Take 20 mg by mouth daily.   rosuvastatin  (CRESTOR ) 20 MG tablet TAKE ONE TABLET BY MOUTH EVERY EVENING 5 DAYS PER WEEK.   sertraline  (ZOLOFT ) 100 MG tablet TAKE 1 TABLET BY MOUTH EVERY NIGHT AT BEDTIME   telmisartan  (MICARDIS ) 80 MG tablet Take 1 tablet (80 mg total) by mouth daily.   VITAMIN D , CHOLECALCIFEROL , PO Take 800 Units by mouth daily.     Allergies:   Codeine and Methotrexate derivatives   Social History   Socioeconomic History   Marital status: Married    Spouse name: Alonda Weaber   Number of children: 2   Years of education: 2   Highest education level: Not on file  Occupational History   Occupation: [employer]  Tobacco Use   Smoking status: Former    Current packs/day: 0.00    Types: Cigarettes    Quit date: 04/24/1985    Years since quitting: 39.1   Smokeless tobacco: Never   Tobacco comments:    Started smoking at 64 yrs old.    Smoked 1 PPD at her heaviest    Quit smoking in 1986  Vaping Use   Vaping status: Never Used  Substance and Sexual Activity   Alcohol  use: Yes    Alcohol/week: 7.0 standard drinks of alcohol    Types: 3 Glasses of wine, 4 Cans of beer per week    Comment: bourbon and diet ginger ale, red wine 3 x per week.   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 There is no history of Ovarian cancer.  ROS:   Please see the history of present illness.     All other systems reviewed and are negative.  EKGs/Labs/Other Studies Reviewed:    The following studies were reviewed today:  EKG Interpretation Date/Time:  Monday June 02 2024 08:30:43 EDT Ventricular Rate:  58 PR Interval:  152 QRS Duration:  76 QT Interval:  472 QTC Calculation: 463 R Axis:   24  Text Interpretation: Sinus bradycardia Nonspecific ST abnormality Confirmed by Darliss Rogue ([id number]) on 06/02/2024 8:36:27 AM    Recent Labs: 11/02/2023: TSH 3.60 05/21/2024: ALT 17; BUN 15; Creatinine, Ser 0.96; Hemoglobin 12.5; Platelets 242; Potassium 3.3; Sodium 135  Recent Lipid Panel    Component Value Date/Time   CHOL 240 (H) 09/27/2023 0919   TRIG 238.0 (H) 09/27/2023 0919   HDL 57.00 09/27/2023 0919   CHOLHDL 4 09/27/2023 0919   VLDL 47.6 (H) 09/27/2023 0919   LDLCALC 135 (H) 09/27/2023 0919   LDLDIRECT 105.0 02/24/2022 0743    Physical Exam:    VS:  BP (!) 140/83 (BP Location: Left Arm, Patient Position: Sitting, Cuff Size: Normal)   Pulse (!) 58   Ht 5' 10 (1.778 m)   Wt 165 lb 12.8 oz (75.2 kg)   SpO2 98%   BMI 23.79 kg/m     Wt Readings from Last 3 Encounters:  06/02/24 165 lb 12.8 oz (75.2 kg)  05/21/24 160 lb (72.6 kg)  10/17/23 175 lb 6.4 oz (79.6 kg)     GEN:  Well nourished, well developed in no acute distress HEENT: Normal NECK: No JVD; No carotid bruits CARDIAC: RRR, no murmurs, rubs, gallops RESPIRATORY:  Clear to auscultation without rales, wheezing or rhonchi  ABDOMEN: Soft, non-tender, non-distended MUSCULOSKELETAL:   No edema; No deformity  SKIN: Warm and dry NEUROLOGIC:  Alert and oriented x 3 PSYCHIATRIC:  Normal affect   ASSESSMENT:    1. Mitral valve insufficiency, unspecified etiology   2. Mixed hyperlipidemia   3. Essential hypertension    PLAN:    In order of problems listed above:  mild to moderate mitral valve regurgitation. echocardiogram on 2/23 showing normal EF 60 to 65%, mild to moderate MR .  Repeat echocardiogram Hyperlipidemia, continue Crestor  20 mg 5 times weekly.  Obtain lipid profile.  Consider Zetia if cholesterol not controlled. Blood pressure, elevated today usually controlled.  Continue telmisartan  80 mg daily, felodipine  5mg  qd.  History of dizziness when standing too quickly or changing positions.  Okay to let BP run a little high in the 130s to 140s.  Follow-up in 3-6 months.  Medication Adjustments/Labs and Tests Ordered: Current medicines are reviewed at length with the patient today.  Concerns regarding medicines are outlined above.  Orders Placed This Encounter  Procedures   Lipid panel   EKG 12-Lead   ECHOCARDIOGRAM COMPLETE    No orders of the defined types were placed in this encounter.    Patient Instructions  Medication Instructions:  Your physician recommends that you continue on your current medications as directed. Please refer to the Current Medication list given to you today.   *If you need a refill on your cardiac medications before your next appointment, please call your pharmacy*  Lab Work: Your provider would like for you to have following labs drawn today lipid panel.   If you have labs (blood work) drawn today and your tests are completely normal, you will receive your results only by: MyChart Message (if you have MyChart) OR A paper copy in the mail If you have any lab test that is abnormal or we need to change your treatment, we will call you to review the results.  Testing/Procedures: Your physician has requested that you have an  echocardiogram. Echocardiography is a painless test that uses sound waves to create images of your heart. It provides your doctor with information about the size and shape of your heart and how well your heart's chambers and valves are working.   You may receive an ultrasound enhancing agent through an IV if needed to better visualize your heart during the echo. This procedure takes approximately one hour.  There are no restrictions for this procedure.  This will take place at [address] [facility] [address]) [address], [place] [address]  Please note: We ask at that you not bring children with you during ultrasound (echo/ vascular) testing. Due to room size and safety concerns, children are not allowed in the ultrasound rooms during exams. Our front office staff cannot provide observation of children in our lobby area while testing is being conducted. An adult accompanying a patient to their appointment will only be allowed in the ultrasound room at the discretion of the ultrasound technician under special circumstances. We apologize for any inconvenience.   Follow-Up: At [facility], you and your health needs are our priority.  As part of our continuing mission to provide you with exceptional heart care, our providers are all part of one team.  This team includes your primary Cardiologist (physician) and Advanced Practice Providers or APPs (Physician Assistants and Nurse Practitioners) who all work together to provide you with the care you need, when you need it.  Your next appointment:   6 month(s)  Provider:   You may see Redell Cave, MD or one of the following Advanced Practice Providers on your designated Care Team:   Lonni Meager, NP Lesley Maffucci, PA-C Bernardino Bring, PA-C Cadence Livermore, PA-C Tylene Lunch, NP Barnie Hila, NP    We recommend signing up for the patient portal called MyChart.  Sign up information is provided on this After Visit Summary.   MyChart is used to connect with patients for Virtual Visits (Telemedicine).  Patients are able to view lab/test results, encounter notes, upcoming appointments, etc.  Non-urgent messages can be sent to your provider as well.   To learn more about what you can do with MyChart, go to [email or web address].          Signed, Redell Cave, MD  06/02/2024 10:00 AM    [facility]

## 2024-06-03 ENCOUNTER — Encounter: Payer: Self-pay | Admitting: Neurology

## 2024-06-03 ENCOUNTER — Ambulatory Visit: Admitting: Neurology

## 2024-06-03 ENCOUNTER — Ambulatory Visit: Payer: Self-pay | Admitting: Cardiology

## 2024-06-03 ENCOUNTER — Telehealth: Payer: Self-pay | Admitting: Neurology

## 2024-06-03 VITALS — BP 148/88 | HR 62 | Ht 70.0 in | Wt 165.5 lb

## 2024-06-03 DIAGNOSIS — R269 Unspecified abnormalities of gait and mobility: Secondary | ICD-10-CM

## 2024-06-03 DIAGNOSIS — R569 Unspecified convulsions: Secondary | ICD-10-CM | POA: Diagnosis not present

## 2024-06-03 DIAGNOSIS — D329 Benign neoplasm of meninges, unspecified: Secondary | ICD-10-CM | POA: Diagnosis not present

## 2024-06-03 DIAGNOSIS — I6381 Other cerebral infarction due to occlusion or stenosis of small artery: Secondary | ICD-10-CM | POA: Diagnosis not present

## 2024-06-03 LAB — LIPID PANEL
Chol/HDL Ratio: 4 ratio (ref 0.0–4.4)
Cholesterol, Total: 187 mg/dL (ref 100–199)
HDL: 47 mg/dL (ref >39)
LDL Chol Calc (NIH): 108 mg/dL — ABNORMAL HIGH (ref 0–99)
Triglycerides: 187 mg/dL — ABNORMAL HIGH (ref 0–149)
VLDL Cholesterol Cal: 32 mg/dL (ref 5–40)

## 2024-06-03 MED ORDER — LEVETIRACETAM 500 MG PO TABS: 500.0000 mg | ORAL_TABLET | Freq: Two times a day (BID) | ORAL | 4 refills | Status: AC

## 2024-06-03 NOTE — Telephone Encounter (Signed)
 no auth required sent to GI [phone]

## 2024-06-04 ENCOUNTER — Encounter: Payer: Self-pay | Admitting: Neurology

## 2024-06-04 ENCOUNTER — Ambulatory Visit: Admitting: Neurology

## 2024-06-04 ENCOUNTER — Other Ambulatory Visit: Payer: Self-pay | Admitting: Family

## 2024-06-04 ENCOUNTER — Other Ambulatory Visit: Payer: Self-pay | Admitting: Cardiology

## 2024-06-04 DIAGNOSIS — R569 Unspecified convulsions: Secondary | ICD-10-CM | POA: Diagnosis not present

## 2024-06-04 DIAGNOSIS — I10 Essential (primary) hypertension: Secondary | ICD-10-CM

## 2024-06-04 NOTE — Progress Notes (Signed)
   [facility]  EEG (ELECTROENCEPHALOGRAM) REPORT   STUDY DATE: 06/04/2024 PATIENT NAME: Danielle Humphrey DOB: 1961-09-05 MRN: [id number]  ORDERING CLINICIAN: Roselle Norton A. Vear, MD. PhD  TECHNOLOGIST: Burnard Plummer, REEGT  TECHNIQUE: Electroencephalogram was recorded utilizing standard 10-20 system of lead placement and reformatted into average and bipolar montages.  RECORDING TIME: 25 minutes 16 seconds  CLINICAL INFORMATION: 63 year old woman with a generalized tonic-clonic seizure  FINDINGS: A digital EEG was performed while the patient was awake and drowsy. While awake and most alert there was increased beta activity with a 14-15 hz posterior dominant rhythm. Voltages and frequencies were symmetric.  There were no focal, lateralizing, epileptiform activity or seizures seen.  Photic stimulation had a normal driving response. Hyperventilation was not performed due to lung disease.. EKG channel shows normal sinus rhythm.  The patient was very briefly drowsy but did not enter definitive sleep.  IMPRESSION: This EEG did not show any epileptiform activity.  Increased beta activity was noted which could be normal or due to increased anxiety or medication effect.   INTERPRETING PHYSICIAN:   Jene Huq A. Vear, MD, PhD, [facility] Certified in Neurology, Clinical Neurophysiology, Sleep Medicine, Pain Medicine and Neuroimaging  [facility] [address], [address] [place], [place] [address] [phone]

## 2024-06-06 ENCOUNTER — Ambulatory Visit: Admitting: Family

## 2024-06-06 ENCOUNTER — Encounter: Payer: Self-pay | Admitting: Family

## 2024-06-06 VITALS — BP 160/100 | HR 64 | Temp 98.3°F | Resp 20 | Ht 70.0 in | Wt 164.2 lb

## 2024-06-06 DIAGNOSIS — I10 Essential (primary) hypertension: Secondary | ICD-10-CM

## 2024-06-06 DIAGNOSIS — Z780 Asymptomatic menopausal state: Secondary | ICD-10-CM | POA: Diagnosis not present

## 2024-06-06 DIAGNOSIS — Z13220 Encounter for screening for lipoid disorders: Secondary | ICD-10-CM | POA: Diagnosis not present

## 2024-06-06 DIAGNOSIS — Z87898 Personal history of other specified conditions: Secondary | ICD-10-CM | POA: Diagnosis not present

## 2024-06-06 DIAGNOSIS — Z8639 Personal history of other endocrine, nutritional and metabolic disease: Secondary | ICD-10-CM

## 2024-06-06 DIAGNOSIS — E7800 Pure hypercholesterolemia, unspecified: Secondary | ICD-10-CM

## 2024-06-06 DIAGNOSIS — Z136 Encounter for screening for cardiovascular disorders: Secondary | ICD-10-CM | POA: Diagnosis not present

## 2024-06-06 DIAGNOSIS — I6381 Other cerebral infarction due to occlusion or stenosis of small artery: Secondary | ICD-10-CM | POA: Diagnosis not present

## 2024-06-06 DIAGNOSIS — G40909 Epilepsy, unspecified, not intractable, without status epilepticus: Secondary | ICD-10-CM

## 2024-06-06 LAB — COMPREHENSIVE METABOLIC PANEL WITH GFR
ALT: 19 U/L (ref 0–35)
AST: 20 U/L (ref 0–37)
Albumin: 4.6 g/dL (ref 3.5–5.2)
Alkaline Phosphatase: 42 U/L (ref 39–117)
BUN: 15 mg/dL (ref 6–23)
CO2: 27 meq/L (ref 19–32)
Calcium: 10.1 mg/dL (ref 8.4–10.5)
Chloride: 102 meq/L (ref 96–112)
Creatinine, Ser: 0.94 mg/dL (ref 0.40–1.20)
GFR: 63.9 mL/min (ref >60.00)
Glucose, Bld: 98 mg/dL (ref 70–99)
Potassium: 4 meq/L (ref 3.5–5.1)
Sodium: 138 meq/L (ref 135–145)
Total Bilirubin: 0.6 mg/dL (ref 0.2–1.2)
Total Protein: 7.4 g/dL (ref 6.0–8.3)

## 2024-06-06 LAB — TSH: TSH: 1.79 u[IU]/mL (ref 0.35–5.50)

## 2024-06-06 LAB — HEMOGLOBIN A1C: Hgb A1c MFr Bld: 5.9 % (ref 4.6–6.5)

## 2024-06-06 LAB — VITAMIN D 25 HYDROXY (VIT D DEFICIENCY, FRACTURES): VITD: 32.99 ng/mL (ref 30.00–100.00)

## 2024-06-06 NOTE — Progress Notes (Unsigned)
 Assessment & Plan:  Primary hypertension Assessment & Plan: Chronic, elevated today.  She has not taken blood pressure medication yet today.  Longstanding history of positional hypotension.  We jointly agreed hesitant to escalate antihypertensive regimen.  Discussed hydration, compression leggings and advise slow positional changes.Continue  felodipine  5 mg, telmisartan  80 mg.  Advised blood pressure log at home and to call me with escalations of blood pressure.  Orders: -     CBC with Differential/Platelet; Future -     Comprehensive metabolic panel with GFR -     TSH  Pure hypercholesterolemia  Encounter for lipid screening for cardiovascular disease  History of prediabetes -     Hemoglobin A1c -     TSH  History of vitamin D  deficiency -     VITAMIN D  25 Hydroxy (Vit-D Deficiency, Fractures)  Asymptomatic postmenopausal state -     DG Bone Density; Future  Seizure disorder [facility]) Assessment & Plan: New onset.  Etiology unclear.  Discussed alcohol use with patient.  Reviewed neurology note.  pending MRI brain.  Follow-up in the meningioma in 1 year with neurology.  She is not driving for 6 months.  Compliant with Keppra .  Will follow   Lacunar infarction [facility]) Assessment & Plan: Discussed risk factor for stroke. LDL is elevated at 108 mg/dL.She is on Crestor  5 days/week.  Continue Zetia as prescribed by Dr Daleen.  Advised trial of increasing crestor  to 7 days a week to achieve LDL less than 70 optimally.  She will let me know how she is doing. Consider PCSK9 inhibitor.       Return precautions given.   Risks, benefits, and alternatives of the medications and treatment plan prescribed today were discussed, and patient expressed understanding.   Education regarding symptom management and diagnosis given to patient on AVS either electronically or printed.  Return in about 4 months (around 10/06/2024).  Rollene Northern, FNP  Subjective:    Patient ID: Danielle Humphrey,  female    DOB: 1961/03/13, 63 y.o.   MRN: [id number]  CC: Danielle Humphrey is a 63 y.o. female who presents today for follow up.   HPI: HPI Discussed the use of AI scribe software for clinical note transcription with the patient, who gave verbal consent to proceed.  History of Present Illness   Danielle Humphrey is a 63 year old female who presents with new onset seizure.  Approximately three nights after switching from her usual wine consumption to bourbon and ginger ale, she experienced a seizure. She consumed three to four drinks per night, each containing one ounce of bourbon. The seizure occurred around 1-2 AM, and her husband initially thought she was having a coughing fit. She was unresponsive and shaking, prompting her husband to call 911. She regained consciousness with EMTs present in her bedroom.  During the emergency room visit, imaging revealed an old calcified meningioma and chronic lacunar infarction. She has a history of hypertension and has been on blood pressure medication, which she has not taken today. She experiences dizziness, particularly when getting up too quickly. She is currently taking blood pressure medication and has recently started Keppra . She has experienced dizziness since her teenage years and manages it by changing positions slowly.  She has a family history of vascular dementia, as her mother had been having mini-strokes and was diagnosed with vascular dementia. Her father died of a massive heart attack. She is concerned about her own risk of dementia and stroke, given her family  history and recent findings of old strokes.  Her current medications include Keppra  for seizure management and Crestor  five days a week for cholesterol management. She is mindful of her hydration and alcohol consumption, noting that she typically drinks water, tea, and occasionally lemonade, and has reduced her alcohol intake.             Presented to ED 05/21/2024 for seizure.  No prior  history of seizure.  Has had witnessed shaking violently in bed.  Lasted approximately 2 minutes CT head without acute intracranial abnormality.Extraaxial calcified lesion overlying the left occipital lobe, likely representing a calcified meningioma.Chronic lacunar infarct within the anterior limb of the right internal capsule. Potassium 3.3 White blood cell 12.7  Dr Daleen started Zetia 10 mg daily based on lipid panel.  Continue on Crestor . Follow-up Dr Daleen 06/14/2024 for mild to moderate mitral valve regurgitation, ordered repeat echocardiogram.  Continued on telmisartan  80 mg daily, felodipine  5 mg daily. Consult neurology 06/03/2024 , Dr vear discussed generalized tonic-clonic seizure with postictal confusion.  Discussed surveillance of calcified meningioma and reimaging this in 1 year.  EEG performed ( resulted normal 06/04/24) .  Ordered MRI brain as well.  Continued on Keppra  and advised not to drive for 6 months.  Discussed alcohol use Allergies: Codeine and Methotrexate derivatives Current Outpatient Medications on File Prior to Visit  Medication Sig Dispense Refill   albuterol  (VENTOLIN  HFA) 108 (90 Base) MCG/ACT inhaler Inhale 2 puffs into the lungs every 6 (six) hours as needed for wheezing or shortness of breath. 8 g 2   budesonide -formoterol  (SYMBICORT ) 160-4.5 MCG/ACT inhaler Inhale 2 puffs into the lungs in the morning and at bedtime. 1 each 12   felodipine  (PLENDIL ) 5 MG 24 hr tablet TAKE 1 TABLET BY MOUTH DAILY 90 tablet 3   levETIRAcetam  (KEPPRA ) 500 MG tablet Take 1 tablet (500 mg total) by mouth 2 (two) times daily. 180 tablet 4   omeprazole  (PRILOSEC OTC) 20 MG tablet Take 20 mg by mouth daily.     rosuvastatin  (CRESTOR ) 20 MG tablet TAKE ONE TABLET BY MOUTH EVERY EVENING 5 DAYS PER WEEK. 20 tablet 8   sertraline  (ZOLOFT ) 100 MG tablet TAKE 1 TABLET BY MOUTH EVERY NIGHT AT BEDTIME 30 tablet 17   telmisartan  (MICARDIS ) 80 MG tablet TAKE 1 TABLET BY MOUTH DAILY 90 tablet 3    VITAMIN D , CHOLECALCIFEROL , PO Take 800 Units by mouth daily.     dextromethorphan-guaiFENesin (MUCINEX DM) 30-600 MG 12hr tablet Take 1 tablet by mouth 2 (two) times daily. (Patient not taking: Reported on 06/03/2024)     No current facility-administered medications on file prior to visit.    Review of Systems  Constitutional:  Negative for chills and fever.  Respiratory:  Negative for cough.   Cardiovascular:  Negative for chest pain and palpitations.  Gastrointestinal:  Negative for nausea and vomiting.      Objective:    BP (!) 160/100   Pulse 64   Temp 98.3 F (36.8 C)   Resp 20   Ht 5' 10 (1.778 m)   Wt 164 lb 4 oz (74.5 kg)   SpO2 98%   BMI 23.57 kg/m  BP Readings from Last 3 Encounters:  06/06/24 (!) 160/100  06/03/24 (!) 148/88  06/02/24 (!) 140/83   Wt Readings from Last 3 Encounters:  06/06/24 164 lb 4 oz (74.5 kg)  06/03/24 165 lb 8 oz (75.1 kg)  06/02/24 165 lb 12.8 oz (75.2 kg)    Physical Exam  Vitals reviewed.  Constitutional:      Appearance: She is well-developed.  Eyes:     Conjunctiva/sclera: Conjunctivae normal.  Cardiovascular:     Rate and Rhythm: Normal rate and regular rhythm.     Pulses: Normal pulses.     Heart sounds: Normal heart sounds.  Pulmonary:     Effort: Pulmonary effort is normal.     Breath sounds: Normal breath sounds. No wheezing, rhonchi or rales.  Skin:    General: Skin is warm and dry.  Neurological:     Mental Status: She is alert.  Psychiatric:        Speech: Speech normal.        Behavior: Behavior normal.        Thought Content: Thought content normal.

## 2024-06-06 NOTE — Patient Instructions (Addendum)
 Please call  and schedule your 3D mammogram and /or bone density scan as we discussed.   [facility]  ( new location in 2023)  [address], [place], [place] [address]  [place], [place]  [phone]   Please take blood pressure medication AS soon as you get home  Monitor blood pressure at home and me 5-6 reading on separate days. Goal is less than 130-135/80.     if persistently higher, please make sooner follow up appointment so we can recheck you blood pressure and manage/ adjust medications.

## 2024-06-07 ENCOUNTER — Ambulatory Visit: Payer: Self-pay | Admitting: Family

## 2024-06-07 DIAGNOSIS — G40909 Epilepsy, unspecified, not intractable, without status epilepticus: Secondary | ICD-10-CM | POA: Insufficient documentation

## 2024-06-07 DIAGNOSIS — I6381 Other cerebral infarction due to occlusion or stenosis of small artery: Secondary | ICD-10-CM | POA: Insufficient documentation

## 2024-06-07 NOTE — Assessment & Plan Note (Addendum)
 Chronic, elevated today.  She has not taken blood pressure medication yet today.  Longstanding history of positional hypotension.  We jointly agreed hesitant to escalate antihypertensive regimen.  Discussed hydration, compression leggings and advise slow positional changes.Continue  felodipine  5 mg, telmisartan  80 mg.  Advised blood pressure log at home and to call me with escalations of blood pressure.

## 2024-06-07 NOTE — Assessment & Plan Note (Addendum)
 New onset.  Etiology unclear.  Discussed alcohol use with patient.  Reviewed neurology note.  pending MRI brain.  Follow-up in the meningioma in 1 year with neurology.  She is not driving for 6 months.  Compliant with Keppra .  Will follow

## 2024-06-07 NOTE — Assessment & Plan Note (Addendum)
 Discussed risk factor for stroke. LDL is elevated at 108 mg/dL.She is on Crestor  5 days/week.  Continue Zetia as prescribed by Dr Daleen.  Advised trial of increasing crestor  to 7 days a week to achieve LDL less than 70 optimally.  She will let me know how she is doing. Consider PCSK9 inhibitor.

## 2024-06-09 ENCOUNTER — Ambulatory Visit: Admitting: Pulmonary Disease

## 2024-06-09 ENCOUNTER — Encounter: Payer: Self-pay | Admitting: Pulmonary Disease

## 2024-06-09 VITALS — BP 128/82 | HR 63 | Temp 97.5°F | Ht 70.0 in | Wt 165.2 lb

## 2024-06-09 DIAGNOSIS — R053 Chronic cough: Secondary | ICD-10-CM | POA: Diagnosis not present

## 2024-06-09 LAB — NITRIC OXIDE: Nitric Oxide: 12

## 2024-06-09 MED ORDER — EZETIMIBE 10 MG PO TABS: 10.0000 mg | ORAL_TABLET | Freq: Every day | ORAL | 3 refills | Status: AC

## 2024-06-09 NOTE — Progress Notes (Addendum)
 Synopsis: Referred in by Dineen Rollene MATSU, FNP   Subjective:   PATIENT ID: Danielle Humphrey GENDER: female DOB: 06/06/1961, MRN: [id number]  Chief Complaint  Patient presents with   Cough    Cough is better. No SOB or wheezing.     HPI Danielle Humphrey is a 63 years old female with a past medical history of hypertension HLD and GERD presenting today to the pulmonary clinic as a referral from her PCP for ongoing chronic cough and an episode of hemoptysis.   She reports she has been struggling with a dry cough for months now. This is mostly exacerbated by a viral infection. She denies any previous dx of asthma.   She denies any hypersensitivity to cold air. But does report that strong scents make her feel dizzy.   It was thought to be secondary to gerd, started on PPI with only minimal improvement.   Recently with an exacerbation of her cough leading to couple episodes of hemoptysis.   CT chest 10/10/2023 - Scatterd GGO most notably in the RUL. No signs of endobronchial disease.   FH - no family history of asthma   SH - Never smoker and does not vape, drinks 3 to 4 drinks per week. Has 1 cat at home.   OV 06/09/2024 - Ms. Hartsough is here to follow up on her CT chests results that show improvement in GGO that are thought to be secondary to an infectious process. Unfortunately she was unable to do herPFTs. She reports doing well on symbicort  and cough has resolved. Furthermore course was complicated by a Seizure about 3 weeks ago. This is her first seizure in her life. CT head with calcified meningioma. She is seeing Dr. Vear at [facility] and is awaiting an MRI of the brain.   ROS All systems were reviewed and are negative except for the above.  Objective:   There were no vitals filed for this visit.    on  RA BMI Readings from Last 3 Encounters:  06/06/24 23.57 kg/m  06/03/24 23.75 kg/m  06/02/24 23.79 kg/m   Wt Readings from Last 3 Encounters:  06/06/24 164 lb 4 oz (74.5 kg)  06/03/24  165 lb 8 oz (75.1 kg)  06/02/24 165 lb 12.8 oz (75.2 kg)    Physical Exam GEN: NAD, Healthy Appearing HEENT: Supple Neck, Reactive Pupils, EOMI  CVS: Normal S1, Normal S2, RRR, No murmurs or ES appreciated  Lungs: Clear bilateral air entry.  Abdomen: Soft, non tender, non distended, + BS  Extremities: Warm and well perfused, No edema  Skin: No suspicious lesions appreciated  Psych: Normal Affect  FENO - 67  EOS - 300   Ancillary Information   CBC    Component Value Date/Time   WBC 12.7 (H) 05/21/2024 0458   RBC 4.07 05/21/2024 0458   HGB 12.5 05/21/2024 0458   HGB 13.5 11/03/2013 1245   HCT 38.2 05/21/2024 0458   HCT 41.0 11/03/2013 1245   PLT 242 05/21/2024 0458   PLT 259 11/03/2013 1245   MCV 93.9 05/21/2024 0458   MCV 91 11/03/2013 1245   MCH 30.7 05/21/2024 0458   MCHC 32.7 05/21/2024 0458   RDW 12.6 05/21/2024 0458   RDW 12.3 11/03/2013 1245   LYMPHSABS 1.6 05/21/2024 0458   MONOABS 0.5 05/21/2024 0458   EOSABS 0.1 05/21/2024 0458   BASOSABS 0.0 05/21/2024 0458   Labs and imaging were reviewed.      No data to display  Assessment & Plan:  Danielle Humphrey is a 63 years old female with a past medical history of hypertension HLD and GERD presenting today to the pulmonary clinic as a referral from her PCP for ongoing chronic cough and an episode of hemoptysis.  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 This lead to an episode of hemoptysis from bronchial injury which has now subsided. Allergen panel + for multiple antigens but mostly dog, cat and dust mites. Total IgE 678.   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   FENP 09/15 --> 12 indicating good response and compliance to ICS/LABA. \  Plan  []  PFTs  []  Echocardiogram.  []  c/w budesonide -Formoterol  [Symbicort ] 160-4.5 2puffs BID.  Advised mouth rinsing afterwards.  []  C/w Albuterol  as needed.     RTC 6 months  I spent 30 minutes caring for this patient today, including preparing to see the patient, obtaining a medical history , reviewing a separately obtained history, performing a medically appropriate examination and/or evaluation, counseling and educating the patient/family/caregiver, documenting clinical information in the electronic health record, and independently interpreting results (not separately reported/billed) and communicating results to the patient/family/caregiver  Darrin Barn, MD [facility] 06/09/2024 8:53 AM

## 2024-06-19 ENCOUNTER — Ambulatory Visit
Admission: RE | Admit: 2024-06-19 | Discharge: 2024-06-19 | Disposition: A | Discharge status: Home / Self Care | Source: Ambulatory Visit | Attending: Neurology | Admitting: Neurology

## 2024-06-19 DIAGNOSIS — R269 Unspecified abnormalities of gait and mobility: Secondary | ICD-10-CM | POA: Diagnosis not present

## 2024-06-19 DIAGNOSIS — D329 Benign neoplasm of meninges, unspecified: Secondary | ICD-10-CM

## 2024-06-19 DIAGNOSIS — R569 Unspecified convulsions: Secondary | ICD-10-CM

## 2024-06-19 DIAGNOSIS — I6381 Other cerebral infarction due to occlusion or stenosis of small artery: Secondary | ICD-10-CM

## 2024-06-19 MED ORDER — GADOPICLENOL 0.5 MMOL/ML IV SOLN
7.5000 mL | Freq: Once | INTRAVENOUS | Status: AC | PRN
  Administered 2024-06-19: 7.5 mL via INTRAVENOUS

## 2024-06-23 ENCOUNTER — Ambulatory Visit: Payer: Self-pay | Admitting: Neurology

## 2024-07-02 ENCOUNTER — Ambulatory Visit
Admission: RE | Admit: 2024-07-02 | Discharge: 2024-07-02 | Disposition: A | Discharge status: Home / Self Care | Source: Ambulatory Visit | Attending: Family | Admitting: Family

## 2024-07-02 DIAGNOSIS — Z1231 Encounter for screening mammogram for malignant neoplasm of breast: Secondary | ICD-10-CM | POA: Diagnosis not present

## 2024-07-03 ENCOUNTER — Other Ambulatory Visit: Payer: Self-pay | Admitting: Cardiology

## 2024-07-03 DIAGNOSIS — I10 Essential (primary) hypertension: Secondary | ICD-10-CM

## 2024-07-03 DIAGNOSIS — E782 Mixed hyperlipidemia: Secondary | ICD-10-CM

## 2024-07-03 DIAGNOSIS — I340 Nonrheumatic mitral (valve) insufficiency: Secondary | ICD-10-CM

## 2024-07-07 ENCOUNTER — Ambulatory Visit: Attending: Cardiology

## 2024-07-07 ENCOUNTER — Other Ambulatory Visit: Payer: Self-pay | Admitting: Cardiology

## 2024-07-07 DIAGNOSIS — I340 Nonrheumatic mitral (valve) insufficiency: Secondary | ICD-10-CM

## 2024-07-07 DIAGNOSIS — I10 Essential (primary) hypertension: Secondary | ICD-10-CM

## 2024-07-07 DIAGNOSIS — E782 Mixed hyperlipidemia: Secondary | ICD-10-CM

## 2024-07-07 LAB — ECHOCARDIOGRAM COMPLETE
AR max vel: 3.04 cm2
AV Area VTI: 3.02 cm2
AV Area mean vel: 2.85 cm2
AV Mean grad: 6 mmHg
AV Peak grad: 10.8 mmHg
Ao pk vel: 1.64 m/s
Area-P 1/2: 2.56 cm2
S' Lateral: 1.6 cm

## 2024-09-04 ENCOUNTER — Telehealth: Payer: Self-pay

## 2024-09-04 NOTE — Telephone Encounter (Signed)
 I called patient to schedule her Welcome to Medicare appointment with Rollene Northern, FNP, but her voicemail box has not been set up yet.  I also sent a MyChart message to patient.  E2C2 - when patient calls back, please assist her with scheduling her Welcome to Medicare appointment with Rollene Northern, FNP.  This is a one hour appointment which needs to take place prior to 05/26/2025.

## 2024-09-19 ENCOUNTER — Encounter: Payer: Self-pay | Admitting: Family

## 2024-09-19 ENCOUNTER — Telehealth: Payer: Self-pay

## 2024-09-19 NOTE — Telephone Encounter (Signed)
 I called patient to reschedule her Welcome to Medicare appointment, as it was scheduled as a 30-minute office visit instead of the required 60 minutes.  I was able to change the visit type to Welcome to Medicare.  I was unable to leave a voicemail message for patient, as her voicemail box has not been set up yet.  I did send a message to patient via MyChart.  E2C2 - when patient calls back, please assist her with rescheduling her Welcome to Medicare visit with Rollene Northern.  The appointment date must be prior to 05/26/2025 and it must be 60-minutes in length.  The visit type should be Welcome to Medicare.

## 2024-09-19 NOTE — Telephone Encounter (Signed)
 Noted

## 2024-09-19 NOTE — Telephone Encounter (Signed)
 Copied from CRM #[id number]. Topic: General - Other >> Sep 19, 2024 10:51 AM Zebedee SAUNDERS wrote: Reason for CRM: Pt returning Myriam Iha P call regarding AWV for 1 hour. Epic will not allow me to schedule. Please call pt [phone] to schedule pt.

## 2024-09-28 ENCOUNTER — Other Ambulatory Visit: Payer: Self-pay | Admitting: Family

## 2024-09-28 DIAGNOSIS — E7800 Pure hypercholesterolemia, unspecified: Secondary | ICD-10-CM

## 2024-10-10 ENCOUNTER — Encounter: Admitting: Family

## 2024-10-15 ENCOUNTER — Encounter: Payer: Self-pay | Admitting: Family

## 2024-10-15 ENCOUNTER — Ambulatory Visit: Admitting: Family

## 2024-10-15 VITALS — BP 138/84 | HR 67 | Temp 98.4°F | Ht 70.0 in | Wt 163.0 lb

## 2024-10-15 DIAGNOSIS — Z23 Encounter for immunization: Secondary | ICD-10-CM

## 2024-10-15 DIAGNOSIS — R7303 Prediabetes: Secondary | ICD-10-CM | POA: Diagnosis not present

## 2024-10-15 DIAGNOSIS — Z136 Encounter for screening for cardiovascular disorders: Secondary | ICD-10-CM

## 2024-10-15 DIAGNOSIS — Z0000 Encounter for general adult medical examination without abnormal findings: Secondary | ICD-10-CM | POA: Diagnosis not present

## 2024-10-15 DIAGNOSIS — Z13220 Encounter for screening for lipoid disorders: Secondary | ICD-10-CM

## 2024-10-15 DIAGNOSIS — Z780 Asymptomatic menopausal state: Secondary | ICD-10-CM

## 2024-10-15 DIAGNOSIS — I10 Essential (primary) hypertension: Secondary | ICD-10-CM | POA: Diagnosis not present

## 2024-10-15 LAB — LIPID PANEL
Cholesterol: 162 mg/dL (ref 28–200)
HDL: 61.7 mg/dL
LDL Cholesterol: 78 mg/dL (ref 10–99)
NonHDL: 100.63
Total CHOL/HDL Ratio: 3
Triglycerides: 115 mg/dL (ref 10.0–149.0)
VLDL: 23 mg/dL (ref 0.0–40.0)

## 2024-10-15 LAB — COMPREHENSIVE METABOLIC PANEL WITH GFR
ALT: 32 U/L (ref 3–35)
AST: 32 U/L (ref 5–37)
Albumin: 5 g/dL (ref 3.5–5.2)
Alkaline Phosphatase: 39 U/L (ref 39–117)
BUN: 17 mg/dL (ref 6–23)
CO2: 28 meq/L (ref 19–32)
Calcium: 10.3 mg/dL (ref 8.4–10.5)
Chloride: 102 meq/L (ref 96–112)
Creatinine, Ser: 0.83 mg/dL (ref 0.40–1.20)
GFR: 74 mL/min
Glucose, Bld: 89 mg/dL (ref 70–99)
Potassium: 3.9 meq/L (ref 3.5–5.1)
Sodium: 138 meq/L (ref 135–145)
Total Bilirubin: 0.7 mg/dL (ref 0.2–1.2)
Total Protein: 7.8 g/dL (ref 6.0–8.3)

## 2024-10-15 LAB — HEMOGLOBIN A1C: Hgb A1c MFr Bld: 5.7 % (ref 4.6–6.5)

## 2024-10-15 NOTE — Assessment & Plan Note (Signed)
" °  Medications reviewed, patient not on opioids.    Screenings administered: SDOH, nutrition habits and nutritional risk, functional status , fall screening, depression scale ( PHQ9) , vision screening, , home and transportation safety, cognitive assessment ( CIT).    No cognitive impairment assessed by observation. Assessed risk of fall, depression.   Counseled on importance of end of life planning by discussing healthcare power of attorney and living will and completion of standard forms.  Patient encouraged to bring this to office so we can add to chart.   Encouraged continued exercise.   Counseled on fall prevention.   "

## 2024-10-15 NOTE — Patient Instructions (Addendum)
 Schedule annual dermatology visit  Tetanus vaccine Tdap when you can Please ensure that you have follow-up  Dr Malka  and Dr Daleen, [facility] heart care.  Please call his office to schedule  [phone]   Please schedule annual eye exam.   Advance directives.    Please call  and schedule your  bone density scan as we discussed.   [facility]  ( new location in 2023)  [address], [place], [place] [address]  [place]  [phone]   Danielle Humphrey , Thank you for taking time to come for your Medicare Wellness Visit. I appreciate your ongoing commitment to your health goals. Please review the following plan we discussed and let me know if I can assist you in the future.   These are the goals we discussed:  Goals   None     This is a list of the screening recommended for you and due dates:  Health Maintenance  Topic Date Due   Pneumococcal Vaccine for age over 48 (1 of 2 - PCV) Never done   DTaP/Tdap/Td vaccine (2 - Tdap) 05/18/2024   Osteoporosis screening with Bone Density Scan  Never done   COVID-19 Vaccine (6 - 2025-26 season) 10/31/2024*   Flu Shot  12/23/2024*   Pap with HPV screening  02/08/2025   Breast Cancer Screening  07/02/2025   Medicare Annual Wellness Visit  10/15/2025   Colon Cancer Screening  12/12/2026   Zoster (Shingles) Vaccine  Completed   Hepatitis B Vaccine  Aged Out   Meningitis B Vaccine  Aged Out   Hepatitis C Screening  Discontinued   HIV Screening  Discontinued  *Topic was postponed. The date shown is not the original due date.

## 2024-10-15 NOTE — Progress Notes (Unsigned)
 "  Chief Complaint  Patient presents with   Medical Management of Chronic Issues    WELCOME TO MEDICARE     Subjective:   Danielle Humphrey is a 64 y.o. female who presents for a Welcome to [facility] Exam.  Compliant with telmisartan  80 mg daily, felodipine  5 mg daily  She is active with young grandchildren  Denies CP, SOB No recurrence of dizziness.  At home 130/80.   Steps daily 6-8/day   Follow-up cardiology 06/02/2024, Dr Daleen for mild to moderate MVR, hyperlipidemia and hypertension.  Continued on telmisartan  80 mg daily, felodipine  5 mg daily.  History of dizziness.  Blood pressure goal between 130-140 is acceptable. Echocardiogram 07/07/2024 left ventricular ejection fraction 60 to 65%.  Mild to moderate mitral valve regurgitation. Follow-up pulmonology, Dr Malka 06/09/2024 for chronic cough. Ordered PFTs, echocardiogram.  Continued on Symbicort  Follow-up with Dr. Vear, neurology 06/03/2024 for surveillance of meningioma  Last eye exam approx 2 years ago.  She plans to make an appointment.    Visit info / Clinical Intake: Persons participating in visit and providing information:: patient Medicare Wellness Visit Mode:: In-person (required for WTM) Interpreter Needed?: No Pre-visit prep was completed: yes Living arrangements:: (Patient-Rptd) lives with spouse/significant other Patient's Overall Health Status Rating: good Typical amount of pain: (Patient-Rptd) some Does pain affect daily life?: (Patient-Rptd) no Are you currently prescribed opioids?: no  Dietary Habits and Nutritional Risks How many meals a day?: (Patient-Rptd) 2 Eats fruit and vegetables daily?: (Patient-Rptd) yes Most meals are obtained by: (Patient-Rptd) preparing own meals Diabetic:: no  Functional Status Activities of Daily Living (to include ambulation/medication): Independent Ambulation: Independent Medication Administration: Independent Home Management (perform basic housework or laundry):  Independent Manage your own finances?: yes Primary transportation is: family / friends Concerns about vision?: (!) yes Concerns about hearing?: no  Fall Screening Falls in the past year?: 0 Number of falls in past year: 0 Was there an injury with Fall?: 0 Fall Risk Category Calculator: 0 Patient Fall Risk Level: Low Fall Risk  Fall Risk Patient at Risk for Falls Due to: No Fall Risks Fall risk Follow up: Falls evaluation completed  Home and Transportation Safety: All rugs have non-skid backing?: yes All stairs or steps have railings?: (!) no Grab bars in the bathtub or shower?: (!) no Have non-skid surface in bathtub or shower?: (Patient-Rptd) yes Good home lighting?: (Patient-Rptd) yes Regular seat belt use?: (Patient-Rptd) yes Hospital stays in the last year:: (Patient-Rptd) no  Cognitive Assessment Difficulty concentrating, remembering, or making decisions? : (Patient-Rptd) no Clock numbers correct?: yes Clock time correct (11:10)?: yes Normal clock drawing test?: 2 How many words correct?: 3 Which version was used?: Version 1: banana, sunrise, chair; Version 3: village kitchen baby Mini-Cog Scoring: 5  Advance Directives (For Healthcare) Does Patient Have a Medical Advance Directive?: No    Allergies (verified) Codeine and Methotrexate and trimetrexate   Current Medications (verified) Outpatient Encounter Medications as of 10/15/2024  Medication Sig   albuterol  (VENTOLIN  HFA) 108 (90 Base) MCG/ACT inhaler Inhale 2 puffs into the lungs every 6 (six) hours as needed for wheezing or shortness of breath.   budesonide -formoterol  (SYMBICORT ) 160-4.5 MCG/ACT inhaler Inhale 2 puffs into the lungs in the morning and at bedtime.   dextromethorphan-guaiFENesin (MUCINEX DM) 30-600 MG 12hr tablet Take 1 tablet by mouth 2 (two) times daily.   ezetimibe  (ZETIA ) 10 MG tablet Take 1 tablet (10 mg total) by mouth daily.   felodipine  (PLENDIL ) 5 MG 24 hr tablet TAKE  1 TABLET BY  MOUTH DAILY   levETIRAcetam  (KEPPRA ) 500 MG tablet Take 1 tablet (500 mg total) by mouth 2 (two) times daily.   omeprazole  (PRILOSEC OTC) 20 MG tablet Take 20 mg by mouth daily.   rosuvastatin  (CRESTOR ) 20 MG tablet TAKE 1 TABLET BY MOUTH EVERY EVENING 5 DAYS PER WEEK   sertraline  (ZOLOFT ) 100 MG tablet TAKE 1 TABLET BY MOUTH EVERY NIGHT AT BEDTIME   telmisartan  (MICARDIS ) 80 MG tablet TAKE 1 TABLET BY MOUTH DAILY   VITAMIN D , CHOLECALCIFEROL , PO Take 800 Units by mouth daily.   No facility-administered encounter medications on file as of 10/15/2024.    History: Past Medical History:  Diagnosis Date   Chickenpox 1960   Hypercholesteremia 2017   Hypertension 1980   Mitral valve prolapse 2016   Shingles 2016   Past Surgical History:  Procedure Laterality Date   AMPUTATION FINGER Left 1981   third finger    APPENDECTOMY Bilateral 1968   BREAST EXCISIONAL BIOPSY Bilateral 2005   benign   BREAST LUMPECTOMY Bilateral 2005   COLONOSCOPY WITH PROPOFOL  N/A 12/12/2019   Procedure: COLONOSCOPY WITH PROPOFOL ;  Surgeon: Unk Corinn Skiff, MD;  Location: [facility] ENDOSCOPY;  Service: Gastroenterology;  Laterality: N/A;   Family History  Problem Relation Age of Onset   Breast cancer Mother 37       48   Dementia Mother        vascular dementia   Stroke Mother    Lupus Mother    Hypercholesterolemia Father    Heart attack Father        4   Hypertension Father    Diabetes type II Sister    Heart disease Brother    Ovarian cancer Neg Hx    Social History   Occupational History   Occupation: Development Worker, [employer]  Tobacco Use   Smoking status: Former    Current packs/day: 0.00    Types: Cigarettes    Quit date: 04/24/1985    Years since quitting: 39.5   Smokeless tobacco: Never   Tobacco comments:    Started smoking at 64 yrs old.    Smoked 1 PPD at her heaviest    Quit smoking in 1986  Vaping Use   Vaping status: Never Used  Substance and Sexual Activity   Alcohol use:  Yes    Alcohol/week: 7.0 standard drinks of alcohol    Types: 3 Glasses of wine, 4 Cans of beer per week    Comment: bourbon and diet ginger ale, red wine 3 x per week.   Drug use: Yes    Types: Marijuana    Comment: Daily   Sexual activity: Yes   Tobacco Counseling Counseling given: Not Answered Tobacco comments: Started smoking at 64 yrs old. Smoked 1 PPD at her heaviest Quit smoking in 1986  SDOH Screenings   Food Insecurity: No Food Insecurity (10/14/2024)  Housing: High Risk (10/14/2024)  Transportation Needs: No Transportation Needs (10/14/2024)  Alcohol Screen: Medium Risk (10/14/2024)  Depression (PHQ2-9): Low Risk (10/15/2024)  Financial Resource Strain: Low Risk (10/14/2024)  Physical Activity: Insufficiently Active (10/14/2024)  Social Connections: Unknown (10/15/2024)  Recent Concern: Social Connections - Moderately Isolated (10/14/2024)  Stress: No Stress Concern Present (10/14/2024)  Tobacco Use: Medium Risk (10/15/2024)   See flowsheets for full screening details  Depression Screen PHQ 2 & 9 Depression Scale- Over the past 2 weeks, how often have you been bothered by any of the following problems? Little interest or pleasure in doing things:  0 Feeling down, depressed, or hopeless (PHQ Adolescent also includes...irritable): 0 PHQ-2 Total Score: 0 Trouble falling or staying asleep, or sleeping too much: 1 Feeling tired or having little energy: 1 Poor appetite or overeating (PHQ Adolescent also includes...weight loss): 2 Feeling bad about yourself - or that you are a failure or have let yourself or your family down: 0 Trouble concentrating on things, such as reading the newspaper or watching television (PHQ Adolescent also includes...like school work): 2 Moving or speaking so slowly that other people could have noticed. Or the opposite - being so fidgety or restless that you have been moving around a lot more than usual: 0 Thoughts that you would be better off dead, or of  hurting yourself in some way: 0 PHQ-9 Total Score: 6 If you checked off any problems, how difficult have these problems made it for you to do your work, take care of things at home, or get along with other people?: Not difficult at all      Goals Addressed   None          Objective:    Today's Vitals   10/15/24 0901 10/15/24 0927  BP: (!) 140/96 138/84  Pulse: 67   Temp: 98.4 F (36.9 C)   TempSrc: Oral   SpO2: 95%   Weight: 163 lb (73.9 kg)   Height: 5' 10 (1.778 m)    Body mass index is 23.39 kg/m.   Physical Exam Vitals reviewed.  Constitutional:      Appearance: She is well-developed.  HENT:     Head: Normocephalic and atraumatic.     Right Ear: Hearing, tympanic membrane, ear canal and external ear normal. No decreased hearing noted. No drainage, swelling or tenderness. No middle ear effusion. No foreign body. Tympanic membrane is not erythematous or bulging.     Left Ear: Hearing, tympanic membrane, ear canal and external ear normal. No decreased hearing noted. No drainage, swelling or tenderness.  No middle ear effusion. No foreign body. Tympanic membrane is not erythematous or bulging.     Nose: No rhinorrhea.     Right Sinus: No maxillary sinus tenderness or frontal sinus tenderness.     Left Sinus: No maxillary sinus tenderness or frontal sinus tenderness.     Mouth/Throat:     Pharynx: Uvula midline. No oropharyngeal exudate or posterior oropharyngeal erythema.     Tonsils: No tonsillar abscesses.  Eyes:     Pupils: Pupils are equal, round, and reactive to light.  Neck:     Thyroid : No thyroid  mass or thyromegaly.  Cardiovascular:     Rate and Rhythm: Normal rate and regular rhythm.     Pulses: Normal pulses.     Heart sounds: Normal heart sounds.  Pulmonary:     Effort: Pulmonary effort is normal.     Breath sounds: Normal breath sounds. No wheezing, rhonchi or rales.  Lymphadenopathy:     Head:     Right side of head: No submental, submandibular,  tonsillar, preauricular, posterior auricular or occipital adenopathy.     Left side of head: No submental, submandibular, tonsillar, preauricular, posterior auricular or occipital adenopathy.     Cervical: No cervical adenopathy.  Skin:    General: Skin is warm and dry.  Neurological:     Mental Status: She is alert.  Psychiatric:        Speech: Speech normal.        Behavior: Behavior normal.        Thought Content: Thought  content normal.   {(optional), or other factors deemed appropriate based on the beneficiary's medical and social history and current clinical standards.   Hearing/Vision screen Vision Screening   Right eye Left eye Both eyes  Without correction 20/50 20/200 20/50  With correction      Immunizations and Health Maintenance Health Maintenance  Topic Date Due   DTaP/Tdap/Td (2 - Tdap) 05/18/2024   Bone Density Scan  Never done   COVID-19 Vaccine (6 - 2025-26 season) 10/31/2024 (Originally 05/26/2024)   Influenza Vaccine  12/23/2024 (Originally 04/25/2024)   Cervical Cancer Screening (HPV/Pap Cotest)  02/08/2025   Mammogram  07/02/2025   Medicare Annual Wellness (AWV)  10/15/2025   Colonoscopy  12/12/2026   Pneumococcal Vaccine: 50+ Years  Completed   Zoster Vaccines- Shingrix  Completed   Hepatitis B Vaccines 19-59 Average Risk  Aged Out   Meningococcal B Vaccine  Aged Out   Hepatitis C Screening  Discontinued   HIV Screening  Discontinued    EKG: unchanged from previous tracings, sinus bradycardia Reviewed prior EKG 06/02/2024, no significant changes.   Of note: EKG was not released in epic. I have signed and EKG was sent to scan to chart ( pending scan at time of note close)  Assessment/Plan:  This is a routine wellness examination for Danielle Humphrey.  Patient Care Team: Dineen Rollene MATSU, FNP as PCP - General (Family Medicine) Darliss Rogue, MD as PCP - Cardiology (Cardiology)  I have personally reviewed and noted the following in the patients chart:    Medical and social history Use of alcohol, tobacco or illicit drugs  Current medications and supplements including opioid prescriptions. Functional ability and status Nutritional status Physical activity Advanced directives List of other physicians Hospitalizations, surgeries, and ER visits in previous 12 months Vitals Screenings to include cognitive, depression, and falls Referrals and appointments  Orders Placed This Encounter  Procedures   DG Bone Density    Standing Status:   Future    Expiration Date:   10/14/2025    Reason for Exam (SYMPTOM  OR DIAGNOSIS REQUIRED):   postmenopausalestrogen deficiency    Preferred imaging location?:   [facility]   Pneumococcal conjugate vaccine 20-valent (Prevnar 20)   Hemoglobin A1c   Comprehensive metabolic panel with GFR    Has the patient fasted?:   No   Lipid panel    Has the patient fasted?:   No   EKG 12-Lead   In addition, I have reviewed and discussed with patient certain preventive protocols, quality metrics, and best practice recommendations. A written personalized care plan for preventive services as well as general preventive health recommendations were provided to patient.   Rollene Dineen, FNP   10/17/2024   Return in 3 months (on 01/13/2025).  "

## 2024-10-16 ENCOUNTER — Ambulatory Visit: Payer: Self-pay | Admitting: Family

## 2024-10-17 ENCOUNTER — Other Ambulatory Visit: Payer: Self-pay

## 2025-01-14 ENCOUNTER — Ambulatory Visit: Admitting: Family

## 2025-06-09 ENCOUNTER — Ambulatory Visit: Admitting: Neurology
